# Patient Record
Sex: Female | Born: 1970 | Race: White | Hispanic: No | Marital: Single | Smoking: Current every day smoker
Health system: Southern US, Community
[De-identification: ages and names within clinical notes are randomized; demographics above are authoritative.]

## PROBLEM LIST (undated history)

## (undated) DIAGNOSIS — R079 Chest pain, unspecified: Secondary | ICD-10-CM

## (undated) DIAGNOSIS — Z720 Tobacco use: Secondary | ICD-10-CM

## (undated) DIAGNOSIS — E785 Hyperlipidemia, unspecified: Secondary | ICD-10-CM

## (undated) DIAGNOSIS — E039 Hypothyroidism, unspecified: Secondary | ICD-10-CM

## (undated) HISTORY — DX: Hyperlipidemia, unspecified: E78.5

## (undated) HISTORY — PX: CARPAL TUNNEL RELEASE: SHX101

## (undated) HISTORY — DX: Hypothyroidism, unspecified: E03.9

## (undated) HISTORY — DX: Chest pain, unspecified: R07.9

## (undated) HISTORY — DX: Tobacco use: Z72.0

## (undated) HISTORY — PX: PARTIAL HYSTERECTOMY: SHX80

---

## 2003-01-25 ENCOUNTER — Ambulatory Visit (HOSPITAL_COMMUNITY): Admission: RE | Admit: 2003-01-25 | Discharge: 2003-01-25 | Payer: Self-pay | Admitting: Obstetrics and Gynecology

## 2003-06-03 ENCOUNTER — Other Ambulatory Visit: Admission: RE | Admit: 2003-06-03 | Discharge: 2003-06-03 | Payer: Self-pay | Admitting: Obstetrics and Gynecology

## 2004-06-22 ENCOUNTER — Other Ambulatory Visit: Admission: RE | Admit: 2004-06-22 | Discharge: 2004-06-22 | Payer: Self-pay | Admitting: Obstetrics and Gynecology

## 2005-04-06 ENCOUNTER — Encounter: Admission: RE | Admit: 2005-04-06 | Discharge: 2005-04-06 | Payer: Self-pay | Admitting: Obstetrics and Gynecology

## 2005-05-26 ENCOUNTER — Inpatient Hospital Stay (HOSPITAL_COMMUNITY): Admission: AD | Admit: 2005-05-26 | Discharge: 2005-05-26 | Payer: Self-pay | Admitting: Obstetrics and Gynecology

## 2005-06-02 ENCOUNTER — Inpatient Hospital Stay (HOSPITAL_COMMUNITY): Admission: RE | Admit: 2005-06-02 | Discharge: 2005-06-05 | Payer: Self-pay | Admitting: Obstetrics and Gynecology

## 2005-07-15 ENCOUNTER — Other Ambulatory Visit: Admission: RE | Admit: 2005-07-15 | Discharge: 2005-07-15 | Payer: Self-pay | Admitting: Obstetrics and Gynecology

## 2006-08-30 ENCOUNTER — Ambulatory Visit (HOSPITAL_COMMUNITY): Admission: RE | Admit: 2006-08-30 | Discharge: 2006-08-31 | Payer: Self-pay | Admitting: Obstetrics and Gynecology

## 2013-02-13 ENCOUNTER — Other Ambulatory Visit (HOSPITAL_COMMUNITY): Payer: Self-pay | Admitting: Obstetrics and Gynecology

## 2013-02-13 ENCOUNTER — Other Ambulatory Visit: Payer: Self-pay | Admitting: Obstetrics and Gynecology

## 2013-02-13 DIAGNOSIS — R1011 Right upper quadrant pain: Secondary | ICD-10-CM

## 2013-02-13 DIAGNOSIS — N644 Mastodynia: Secondary | ICD-10-CM

## 2013-02-14 ENCOUNTER — Ambulatory Visit (HOSPITAL_COMMUNITY): Payer: Self-pay

## 2013-02-19 ENCOUNTER — Ambulatory Visit (HOSPITAL_COMMUNITY)
Admission: RE | Admit: 2013-02-19 | Discharge: 2013-02-19 | Disposition: A | Discharge status: Home / Self Care | Payer: BC Managed Care – PPO | Source: Ambulatory Visit | Attending: Obstetrics and Gynecology | Admitting: Obstetrics and Gynecology

## 2013-02-19 DIAGNOSIS — R1011 Right upper quadrant pain: Secondary | ICD-10-CM

## 2013-02-19 DIAGNOSIS — R110 Nausea: Secondary | ICD-10-CM | POA: Insufficient documentation

## 2013-02-22 ENCOUNTER — Ambulatory Visit
Admission: RE | Admit: 2013-02-22 | Discharge: 2013-02-22 | Disposition: A | Discharge status: Home / Self Care | Payer: BC Managed Care – PPO | Source: Ambulatory Visit | Attending: Obstetrics and Gynecology | Admitting: Obstetrics and Gynecology

## 2013-02-22 DIAGNOSIS — N644 Mastodynia: Secondary | ICD-10-CM

## 2015-04-14 ENCOUNTER — Other Ambulatory Visit (HOSPITAL_COMMUNITY): Payer: Self-pay | Admitting: Obstetrics and Gynecology

## 2015-04-15 LAB — CYTOLOGY - PAP

## 2015-12-07 HISTORY — PX: BREAST BIOPSY: SHX20

## 2016-04-26 DIAGNOSIS — Z01419 Encounter for gynecological examination (general) (routine) without abnormal findings: Secondary | ICD-10-CM | POA: Diagnosis not present

## 2016-04-26 DIAGNOSIS — Z1321 Encounter for screening for nutritional disorder: Secondary | ICD-10-CM | POA: Diagnosis not present

## 2016-04-26 DIAGNOSIS — Z01411 Encounter for gynecological examination (general) (routine) with abnormal findings: Secondary | ICD-10-CM | POA: Diagnosis not present

## 2016-04-26 DIAGNOSIS — N951 Menopausal and female climacteric states: Secondary | ICD-10-CM | POA: Diagnosis not present

## 2016-04-26 DIAGNOSIS — Z6838 Body mass index (BMI) 38.0-38.9, adult: Secondary | ICD-10-CM | POA: Diagnosis not present

## 2016-04-26 DIAGNOSIS — N644 Mastodynia: Secondary | ICD-10-CM | POA: Diagnosis not present

## 2016-04-26 DIAGNOSIS — R232 Flushing: Secondary | ICD-10-CM | POA: Diagnosis not present

## 2016-04-27 ENCOUNTER — Other Ambulatory Visit: Payer: Self-pay | Admitting: Obstetrics and Gynecology

## 2016-04-27 DIAGNOSIS — N644 Mastodynia: Secondary | ICD-10-CM

## 2016-04-30 ENCOUNTER — Other Ambulatory Visit: Payer: Self-pay

## 2016-05-04 ENCOUNTER — Other Ambulatory Visit: Payer: Self-pay | Admitting: Obstetrics and Gynecology

## 2016-05-04 ENCOUNTER — Ambulatory Visit
Admission: RE | Admit: 2016-05-04 | Discharge: 2016-05-04 | Disposition: A | Discharge status: Home / Self Care | Payer: BLUE CROSS/BLUE SHIELD | Source: Ambulatory Visit | Attending: Obstetrics and Gynecology | Admitting: Obstetrics and Gynecology

## 2016-05-04 DIAGNOSIS — N644 Mastodynia: Secondary | ICD-10-CM

## 2016-05-04 DIAGNOSIS — R928 Other abnormal and inconclusive findings on diagnostic imaging of breast: Secondary | ICD-10-CM

## 2016-05-06 ENCOUNTER — Other Ambulatory Visit: Payer: Self-pay | Admitting: Obstetrics and Gynecology

## 2016-05-06 ENCOUNTER — Ambulatory Visit
Admission: RE | Admit: 2016-05-06 | Discharge: 2016-05-06 | Disposition: A | Discharge status: Home / Self Care | Payer: BLUE CROSS/BLUE SHIELD | Source: Ambulatory Visit | Attending: Obstetrics and Gynecology | Admitting: Obstetrics and Gynecology

## 2016-05-06 DIAGNOSIS — R928 Other abnormal and inconclusive findings on diagnostic imaging of breast: Secondary | ICD-10-CM

## 2016-05-06 DIAGNOSIS — N6012 Diffuse cystic mastopathy of left breast: Secondary | ICD-10-CM | POA: Diagnosis not present

## 2016-05-08 DIAGNOSIS — F17200 Nicotine dependence, unspecified, uncomplicated: Secondary | ICD-10-CM | POA: Diagnosis not present

## 2016-05-08 DIAGNOSIS — G5762 Lesion of plantar nerve, left lower limb: Secondary | ICD-10-CM | POA: Diagnosis not present

## 2016-05-17 DIAGNOSIS — E039 Hypothyroidism, unspecified: Secondary | ICD-10-CM | POA: Diagnosis not present

## 2016-05-17 DIAGNOSIS — N644 Mastodynia: Secondary | ICD-10-CM | POA: Diagnosis not present

## 2016-05-17 DIAGNOSIS — E7800 Pure hypercholesterolemia, unspecified: Secondary | ICD-10-CM | POA: Diagnosis not present

## 2016-05-17 DIAGNOSIS — R7301 Impaired fasting glucose: Secondary | ICD-10-CM | POA: Diagnosis not present

## 2016-05-31 DIAGNOSIS — R635 Abnormal weight gain: Secondary | ICD-10-CM | POA: Diagnosis not present

## 2016-05-31 DIAGNOSIS — R5383 Other fatigue: Secondary | ICD-10-CM | POA: Diagnosis not present

## 2016-05-31 DIAGNOSIS — E039 Hypothyroidism, unspecified: Secondary | ICD-10-CM | POA: Diagnosis not present

## 2016-05-31 DIAGNOSIS — R7301 Impaired fasting glucose: Secondary | ICD-10-CM | POA: Diagnosis not present

## 2016-08-13 ENCOUNTER — Other Ambulatory Visit: Payer: Self-pay | Admitting: Obstetrics and Gynecology

## 2016-08-13 DIAGNOSIS — R928 Other abnormal and inconclusive findings on diagnostic imaging of breast: Secondary | ICD-10-CM

## 2016-08-13 DIAGNOSIS — N6452 Nipple discharge: Secondary | ICD-10-CM

## 2016-08-13 DIAGNOSIS — N6012 Diffuse cystic mastopathy of left breast: Secondary | ICD-10-CM

## 2016-08-30 ENCOUNTER — Other Ambulatory Visit: Payer: BLUE CROSS/BLUE SHIELD

## 2016-09-28 DIAGNOSIS — M542 Cervicalgia: Secondary | ICD-10-CM | POA: Diagnosis not present

## 2016-09-28 DIAGNOSIS — M7581 Other shoulder lesions, right shoulder: Secondary | ICD-10-CM | POA: Diagnosis not present

## 2016-09-28 DIAGNOSIS — G5603 Carpal tunnel syndrome, bilateral upper limbs: Secondary | ICD-10-CM | POA: Diagnosis not present

## 2016-11-08 ENCOUNTER — Ambulatory Visit
Admission: RE | Admit: 2016-11-08 | Discharge: 2016-11-08 | Disposition: A | Discharge status: Home / Self Care | Payer: BLUE CROSS/BLUE SHIELD | Source: Ambulatory Visit | Attending: Obstetrics and Gynecology | Admitting: Obstetrics and Gynecology

## 2016-11-08 ENCOUNTER — Other Ambulatory Visit: Payer: Self-pay | Admitting: Physician Assistant

## 2016-11-08 DIAGNOSIS — R1011 Right upper quadrant pain: Secondary | ICD-10-CM

## 2016-11-08 DIAGNOSIS — R928 Other abnormal and inconclusive findings on diagnostic imaging of breast: Secondary | ICD-10-CM

## 2016-11-08 DIAGNOSIS — R112 Nausea with vomiting, unspecified: Secondary | ICD-10-CM

## 2016-11-08 DIAGNOSIS — R1013 Epigastric pain: Secondary | ICD-10-CM

## 2016-11-08 DIAGNOSIS — R198 Other specified symptoms and signs involving the digestive system and abdomen: Secondary | ICD-10-CM | POA: Diagnosis not present

## 2016-11-15 ENCOUNTER — Other Ambulatory Visit: Payer: BLUE CROSS/BLUE SHIELD

## 2016-11-15 ENCOUNTER — Ambulatory Visit
Admission: RE | Admit: 2016-11-15 | Discharge: 2016-11-15 | Disposition: A | Discharge status: Home / Self Care | Payer: BLUE CROSS/BLUE SHIELD | Source: Ambulatory Visit | Attending: Physician Assistant | Admitting: Physician Assistant

## 2016-11-15 DIAGNOSIS — R1011 Right upper quadrant pain: Secondary | ICD-10-CM | POA: Diagnosis not present

## 2016-11-15 DIAGNOSIS — R112 Nausea with vomiting, unspecified: Secondary | ICD-10-CM | POA: Diagnosis not present

## 2016-11-15 DIAGNOSIS — R1013 Epigastric pain: Secondary | ICD-10-CM

## 2016-11-15 DIAGNOSIS — K209 Esophagitis, unspecified: Secondary | ICD-10-CM | POA: Diagnosis not present

## 2016-11-16 ENCOUNTER — Other Ambulatory Visit: Payer: BLUE CROSS/BLUE SHIELD

## 2016-11-22 DIAGNOSIS — E039 Hypothyroidism, unspecified: Secondary | ICD-10-CM | POA: Diagnosis not present

## 2016-11-22 DIAGNOSIS — R1013 Epigastric pain: Secondary | ICD-10-CM | POA: Diagnosis not present

## 2016-11-22 DIAGNOSIS — R7301 Impaired fasting glucose: Secondary | ICD-10-CM | POA: Diagnosis not present

## 2016-11-22 DIAGNOSIS — E7800 Pure hypercholesterolemia, unspecified: Secondary | ICD-10-CM | POA: Diagnosis not present

## 2016-11-22 DIAGNOSIS — Z800 Family history of malignant neoplasm of digestive organs: Secondary | ICD-10-CM | POA: Diagnosis not present

## 2016-12-06 HISTORY — PX: BREAST BIOPSY: SHX20

## 2016-12-13 DIAGNOSIS — R7301 Impaired fasting glucose: Secondary | ICD-10-CM | POA: Diagnosis not present

## 2016-12-13 DIAGNOSIS — R5383 Other fatigue: Secondary | ICD-10-CM | POA: Diagnosis not present

## 2016-12-13 DIAGNOSIS — E039 Hypothyroidism, unspecified: Secondary | ICD-10-CM | POA: Diagnosis not present

## 2016-12-13 DIAGNOSIS — R635 Abnormal weight gain: Secondary | ICD-10-CM | POA: Diagnosis not present

## 2017-01-27 ENCOUNTER — Other Ambulatory Visit: Payer: Self-pay | Admitting: Obstetrics and Gynecology

## 2017-01-27 DIAGNOSIS — N6012 Diffuse cystic mastopathy of left breast: Secondary | ICD-10-CM

## 2017-05-03 DIAGNOSIS — Z6836 Body mass index (BMI) 36.0-36.9, adult: Secondary | ICD-10-CM | POA: Diagnosis not present

## 2017-05-03 DIAGNOSIS — Z01419 Encounter for gynecological examination (general) (routine) without abnormal findings: Secondary | ICD-10-CM | POA: Diagnosis not present

## 2017-05-09 ENCOUNTER — Ambulatory Visit
Admission: RE | Admit: 2017-05-09 | Discharge: 2017-05-09 | Disposition: A | Discharge status: Home / Self Care | Payer: BLUE CROSS/BLUE SHIELD | Source: Ambulatory Visit | Attending: Obstetrics and Gynecology | Admitting: Obstetrics and Gynecology

## 2017-05-09 ENCOUNTER — Other Ambulatory Visit: Payer: Self-pay | Admitting: Obstetrics and Gynecology

## 2017-05-09 DIAGNOSIS — N6012 Diffuse cystic mastopathy of left breast: Secondary | ICD-10-CM

## 2017-05-09 DIAGNOSIS — Z131 Encounter for screening for diabetes mellitus: Secondary | ICD-10-CM | POA: Diagnosis not present

## 2017-05-09 DIAGNOSIS — E039 Hypothyroidism, unspecified: Secondary | ICD-10-CM | POA: Diagnosis not present

## 2017-05-09 DIAGNOSIS — N6489 Other specified disorders of breast: Secondary | ICD-10-CM | POA: Diagnosis not present

## 2017-05-09 DIAGNOSIS — E7800 Pure hypercholesterolemia, unspecified: Secondary | ICD-10-CM | POA: Diagnosis not present

## 2017-05-09 DIAGNOSIS — R928 Other abnormal and inconclusive findings on diagnostic imaging of breast: Secondary | ICD-10-CM | POA: Diagnosis not present

## 2017-05-09 DIAGNOSIS — R7301 Impaired fasting glucose: Secondary | ICD-10-CM | POA: Diagnosis not present

## 2017-05-11 ENCOUNTER — Ambulatory Visit
Admission: RE | Admit: 2017-05-11 | Discharge: 2017-05-11 | Disposition: A | Discharge status: Home / Self Care | Payer: BLUE CROSS/BLUE SHIELD | Source: Ambulatory Visit | Attending: Obstetrics and Gynecology | Admitting: Obstetrics and Gynecology

## 2017-05-11 ENCOUNTER — Other Ambulatory Visit: Payer: Self-pay | Admitting: Obstetrics and Gynecology

## 2017-05-11 DIAGNOSIS — N6489 Other specified disorders of breast: Secondary | ICD-10-CM

## 2017-05-11 DIAGNOSIS — N6012 Diffuse cystic mastopathy of left breast: Secondary | ICD-10-CM | POA: Diagnosis not present

## 2017-05-30 DIAGNOSIS — R635 Abnormal weight gain: Secondary | ICD-10-CM | POA: Diagnosis not present

## 2017-05-30 DIAGNOSIS — E039 Hypothyroidism, unspecified: Secondary | ICD-10-CM | POA: Diagnosis not present

## 2017-05-30 DIAGNOSIS — R7301 Impaired fasting glucose: Secondary | ICD-10-CM | POA: Diagnosis not present

## 2017-05-30 DIAGNOSIS — R5383 Other fatigue: Secondary | ICD-10-CM | POA: Diagnosis not present

## 2017-06-14 DIAGNOSIS — L723 Sebaceous cyst: Secondary | ICD-10-CM | POA: Diagnosis not present

## 2017-09-18 IMAGING — MG MM DIAG BREAST TOMO UNI LEFT
6 series · 6 of 14 positions shown · non-contrast
Comparison: Previous exam(s).

CLINICAL DATA: Post biopsy mammogram of the left breast for clip
placement.

EXAM:
3D DIAGNOSTIC LEFT MAMMOGRAM POST STEREOTACTIC BIOPSY

[L ML]
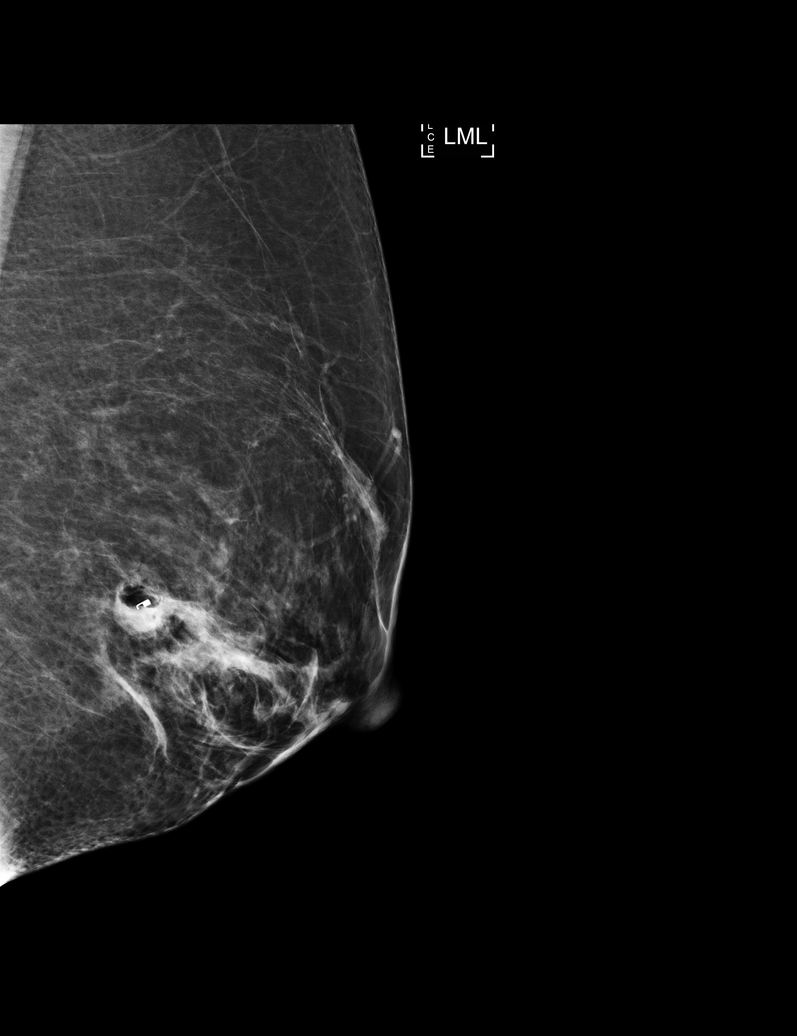

[L ML synth-2D]
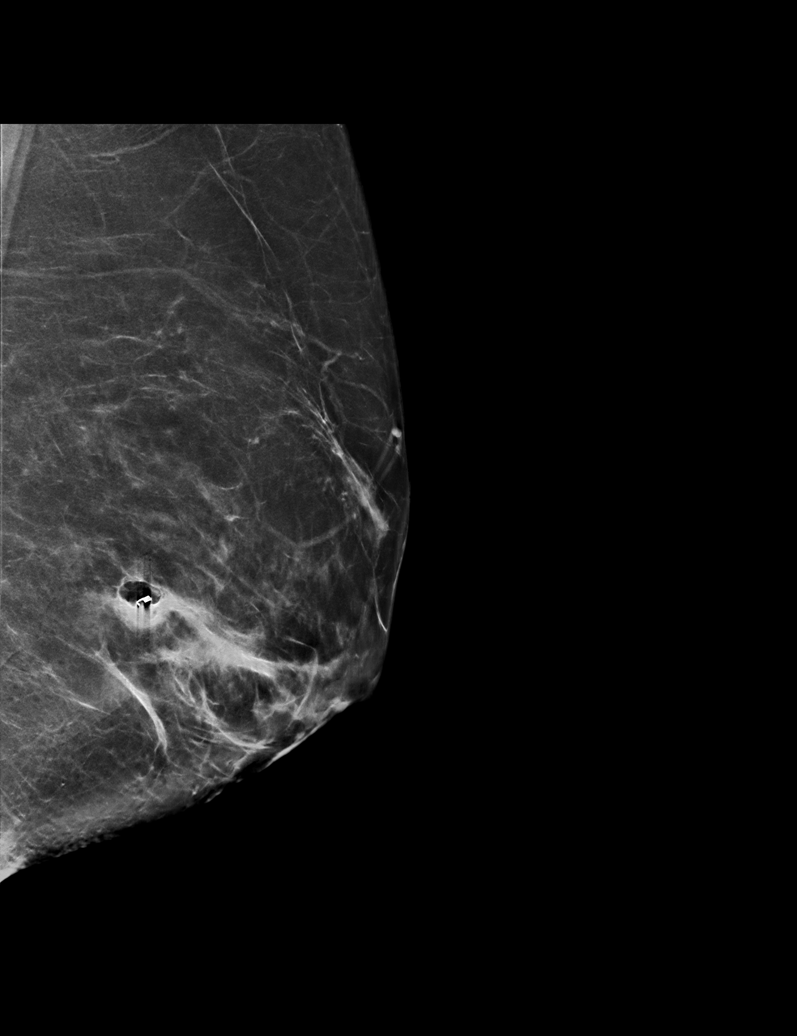

[L CC]
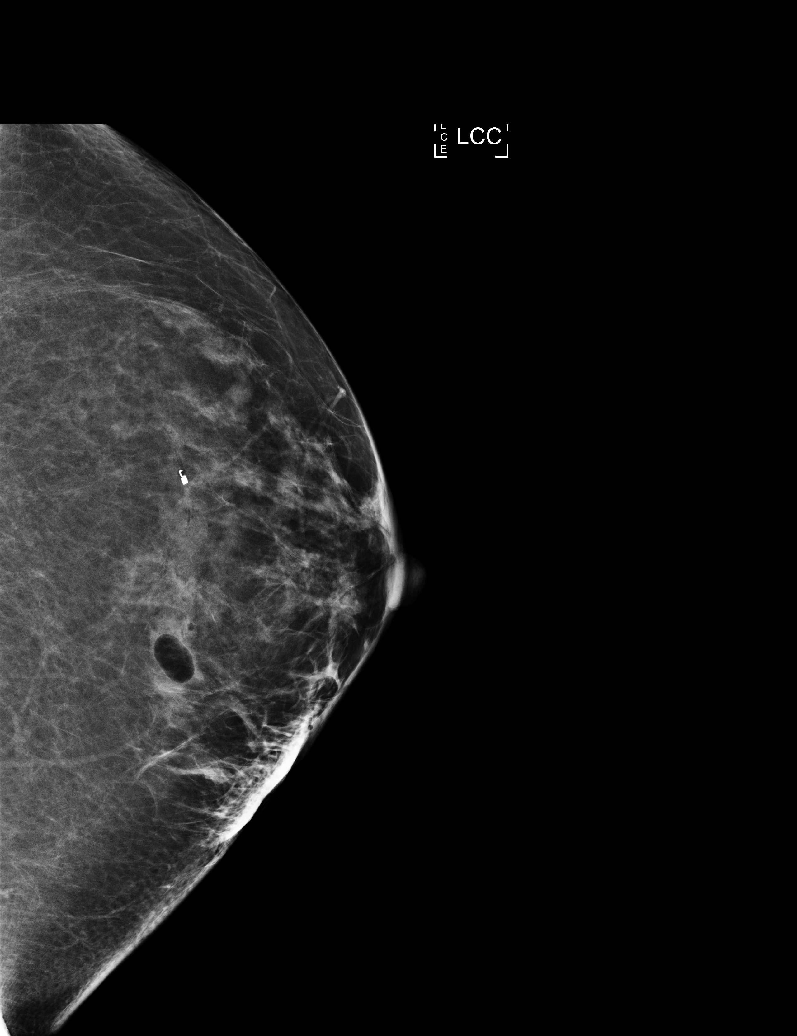

[L CC synth-2D]
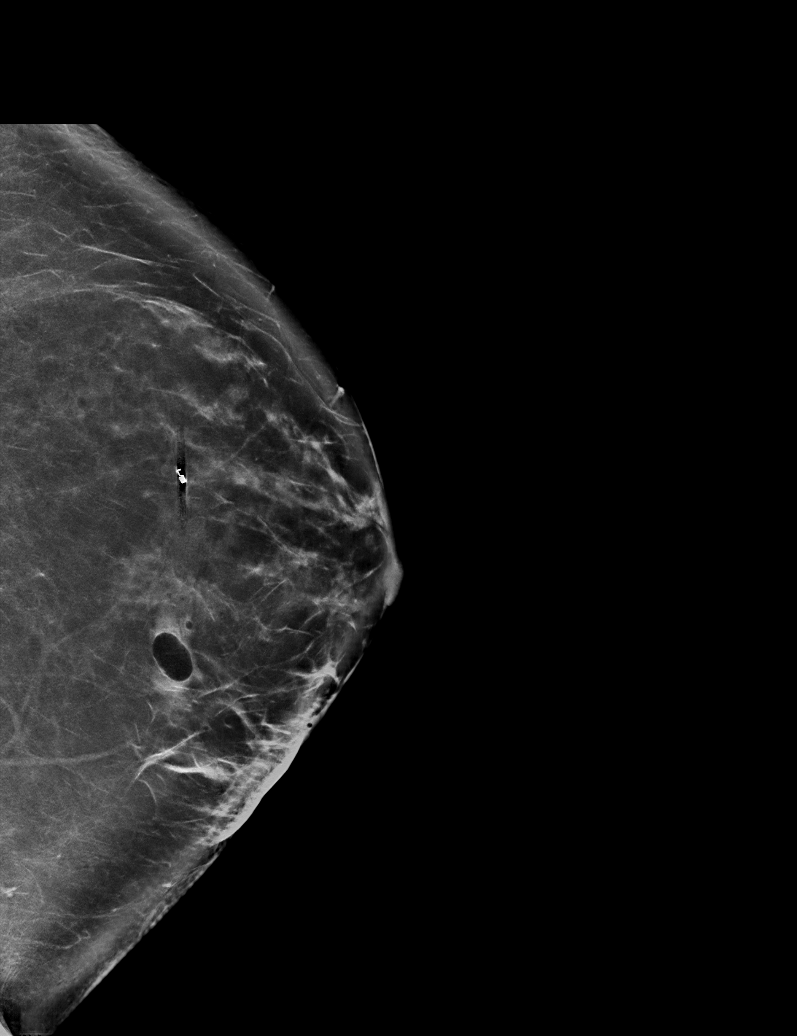

[L ML tomo · tomo slice 42/83.0]
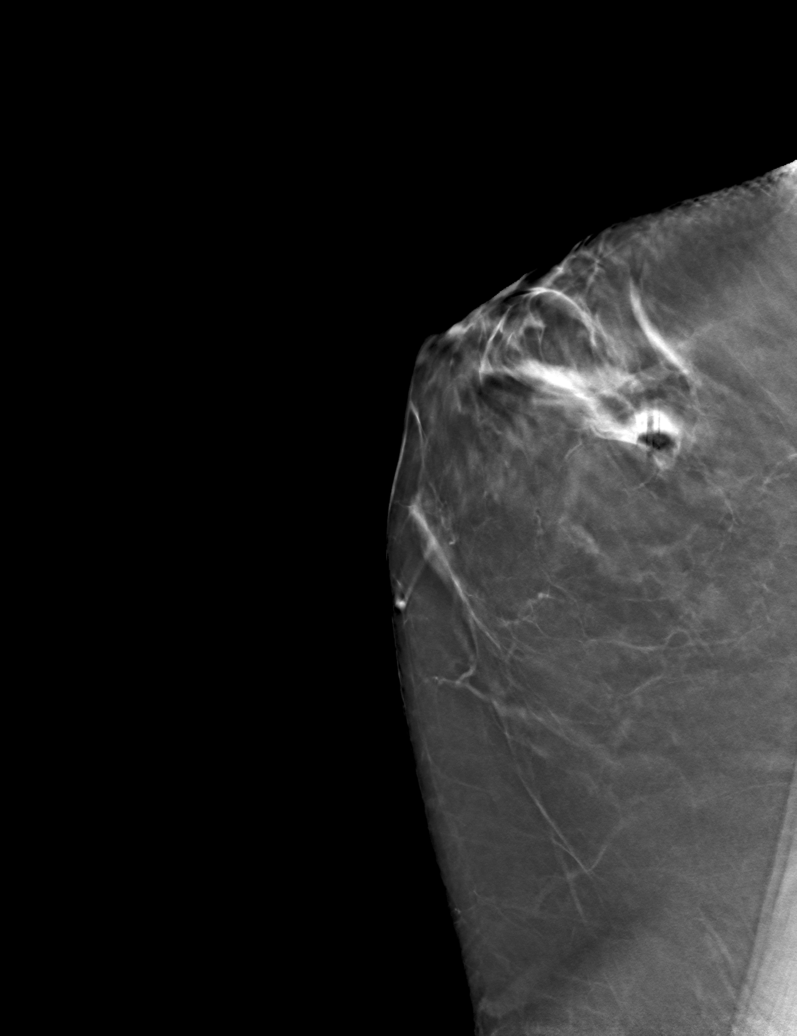

[L CC tomo · tomo slice 43/85.0]
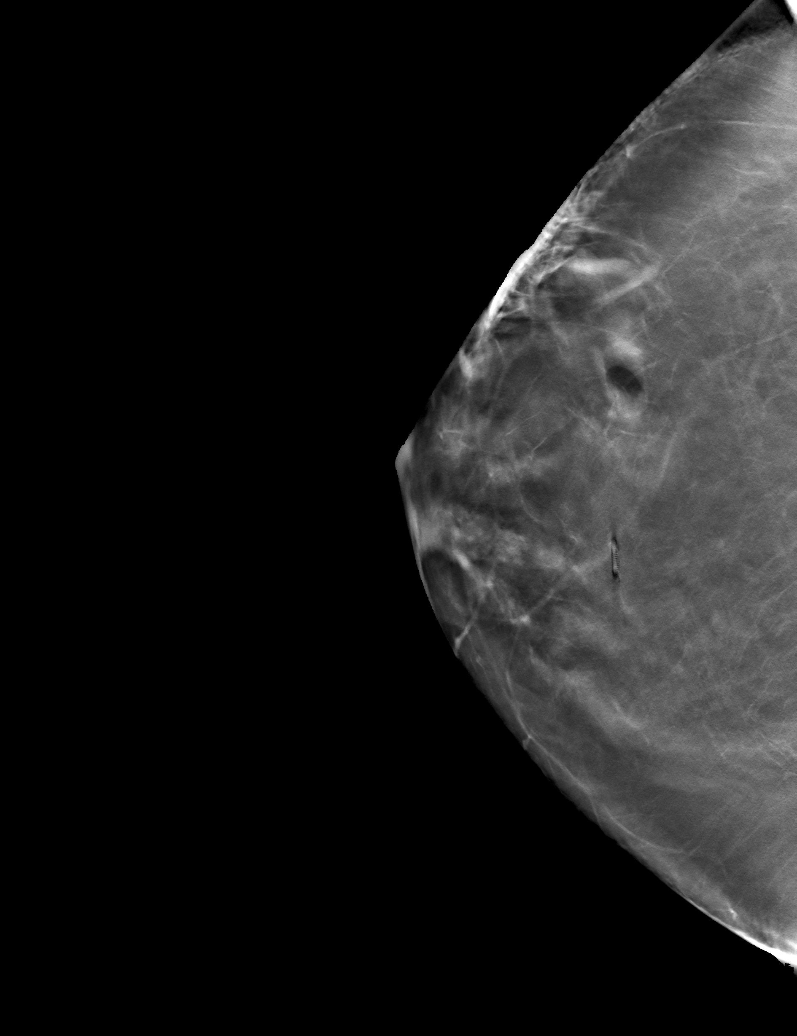

[6 of 14 positions shown; findings below may reference images not displayed]

FINDINGS: 3D Mammographic images were obtained following stereotactic guided
biopsy of an asymmetry in the lower-inner left breast. The coil
shaped biopsy marking clip is approximately 3.7 cm laterally
displaced from the site of biopsy.
IMPRESSION: Approximately 3.7 cm of lateral displacement from the site of
biopsy.

Final Assessment: Post Procedure Mammograms for Marker Placement

## 2017-11-07 DIAGNOSIS — E039 Hypothyroidism, unspecified: Secondary | ICD-10-CM | POA: Diagnosis not present

## 2017-11-07 DIAGNOSIS — R7301 Impaired fasting glucose: Secondary | ICD-10-CM | POA: Diagnosis not present

## 2017-11-07 DIAGNOSIS — E7800 Pure hypercholesterolemia, unspecified: Secondary | ICD-10-CM | POA: Diagnosis not present

## 2018-01-23 DIAGNOSIS — E039 Hypothyroidism, unspecified: Secondary | ICD-10-CM | POA: Diagnosis not present

## 2018-01-23 DIAGNOSIS — J111 Influenza due to unidentified influenza virus with other respiratory manifestations: Secondary | ICD-10-CM | POA: Diagnosis not present

## 2018-01-30 DIAGNOSIS — R5383 Other fatigue: Secondary | ICD-10-CM | POA: Diagnosis not present

## 2018-01-30 DIAGNOSIS — E039 Hypothyroidism, unspecified: Secondary | ICD-10-CM | POA: Diagnosis not present

## 2018-01-30 DIAGNOSIS — R635 Abnormal weight gain: Secondary | ICD-10-CM | POA: Diagnosis not present

## 2018-01-30 DIAGNOSIS — R7301 Impaired fasting glucose: Secondary | ICD-10-CM | POA: Diagnosis not present

## 2018-02-23 ENCOUNTER — Other Ambulatory Visit: Payer: Self-pay | Admitting: Obstetrics and Gynecology

## 2018-02-23 DIAGNOSIS — Z139 Encounter for screening, unspecified: Secondary | ICD-10-CM

## 2018-03-27 DIAGNOSIS — E039 Hypothyroidism, unspecified: Secondary | ICD-10-CM | POA: Diagnosis not present

## 2018-05-15 ENCOUNTER — Ambulatory Visit
Admission: RE | Admit: 2018-05-15 | Discharge: 2018-05-15 | Disposition: A | Discharge status: Home / Self Care | Payer: BLUE CROSS/BLUE SHIELD | Source: Ambulatory Visit | Attending: Obstetrics and Gynecology | Admitting: Obstetrics and Gynecology

## 2018-05-15 DIAGNOSIS — Z1231 Encounter for screening mammogram for malignant neoplasm of breast: Secondary | ICD-10-CM | POA: Diagnosis not present

## 2018-05-15 DIAGNOSIS — Z139 Encounter for screening, unspecified: Secondary | ICD-10-CM

## 2018-05-22 DIAGNOSIS — Z01419 Encounter for gynecological examination (general) (routine) without abnormal findings: Secondary | ICD-10-CM | POA: Diagnosis not present

## 2018-05-22 DIAGNOSIS — E039 Hypothyroidism, unspecified: Secondary | ICD-10-CM | POA: Diagnosis not present

## 2018-05-22 DIAGNOSIS — Z6833 Body mass index (BMI) 33.0-33.9, adult: Secondary | ICD-10-CM | POA: Diagnosis not present

## 2018-05-29 DIAGNOSIS — G5602 Carpal tunnel syndrome, left upper limb: Secondary | ICD-10-CM | POA: Diagnosis not present

## 2018-06-10 DIAGNOSIS — S61233A Puncture wound without foreign body of left middle finger without damage to nail, initial encounter: Secondary | ICD-10-CM | POA: Diagnosis not present

## 2018-06-10 DIAGNOSIS — L03012 Cellulitis of left finger: Secondary | ICD-10-CM | POA: Diagnosis not present

## 2018-06-14 DIAGNOSIS — L02512 Cutaneous abscess of left hand: Secondary | ICD-10-CM | POA: Diagnosis not present

## 2018-06-14 DIAGNOSIS — L0390 Cellulitis, unspecified: Secondary | ICD-10-CM | POA: Diagnosis not present

## 2018-07-10 DIAGNOSIS — R7301 Impaired fasting glucose: Secondary | ICD-10-CM | POA: Diagnosis not present

## 2018-07-10 DIAGNOSIS — E039 Hypothyroidism, unspecified: Secondary | ICD-10-CM | POA: Diagnosis not present

## 2018-07-10 DIAGNOSIS — E7800 Pure hypercholesterolemia, unspecified: Secondary | ICD-10-CM | POA: Diagnosis not present

## 2018-07-10 DIAGNOSIS — G5602 Carpal tunnel syndrome, left upper limb: Secondary | ICD-10-CM | POA: Diagnosis not present

## 2018-07-31 DIAGNOSIS — E039 Hypothyroidism, unspecified: Secondary | ICD-10-CM | POA: Diagnosis not present

## 2018-07-31 DIAGNOSIS — R5383 Other fatigue: Secondary | ICD-10-CM | POA: Diagnosis not present

## 2018-07-31 DIAGNOSIS — R635 Abnormal weight gain: Secondary | ICD-10-CM | POA: Diagnosis not present

## 2018-07-31 DIAGNOSIS — R7301 Impaired fasting glucose: Secondary | ICD-10-CM | POA: Diagnosis not present

## 2018-12-29 ENCOUNTER — Other Ambulatory Visit: Payer: Self-pay | Admitting: *Deleted

## 2018-12-29 ENCOUNTER — Encounter: Payer: Self-pay | Admitting: Neurology

## 2018-12-29 DIAGNOSIS — G5602 Carpal tunnel syndrome, left upper limb: Secondary | ICD-10-CM

## 2019-01-16 ENCOUNTER — Encounter: Payer: Self-pay | Admitting: Neurology

## 2019-01-16 ENCOUNTER — Ambulatory Visit (INDEPENDENT_AMBULATORY_CARE_PROVIDER_SITE_OTHER): Payer: BLUE CROSS/BLUE SHIELD | Admitting: Neurology

## 2019-01-16 DIAGNOSIS — G5602 Carpal tunnel syndrome, left upper limb: Secondary | ICD-10-CM

## 2019-01-16 NOTE — Procedures (Signed)
[facility]  [address], [address]  [place], [place] [address] Tel: [phone] Fax:  [phone] Test Date:  01/16/2019  Patient: Margaret Goodwin DOB: May 12, 1971 Physician: Nita Sickle, DO  Sex: Female Height: 5\' 4"  Ref Phys: Teryl Lucy, MD  ID#: [id number] Temp: 35.7C Technician:    Patient Complaints: This is a 49 year old female with history of right carpal tunnel release referred for evaluation of left hand numbness and pain.  NCV & EMG Findings: Extensive electrodiagnostic testing of the left upper extremity shows:  1. Left median sensory response shows prolonged distal peak latency (3.7 ms) and reduced amplitude (18.2 V).  Left ulnar sensory responses within normal limits. 2. Left median motor response shows prolonged distal onset latency (4.3 ms).  Left ulnar motor responses within normal limits.  3. There is no evidence of active or chronic motor axonal loss changes affecting any of the tested muscles.  Motor unit configuration and recruitment pattern is within normal limits.    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     ___________________________ Nita Sickle, DO    Nerve Conduction Studies Anti Sensory Summary Table   Site NR Peak (ms) Norm Peak (ms) P-T Amp (V) Norm P-T Amp  Left Median Anti Sensory (2nd Digit)  35.7C  Wrist    3.7 <3.4 18.2 >20  Left Ulnar Anti Sensory (5th Digit)  35.7C  Wrist    2.3 <3.1 36.6 >12   Motor Summary Table   Site NR Onset (ms) Norm Onset (ms) O-P Amp (mV) Norm O-P Amp Site1 Site2 Delta-0 (ms) Dist (cm) Vel (m/s) Norm Vel (m/s)  Left Median Motor (Abd Poll Brev)  35.7C  Wrist    4.3 <3.9 12.5 >6 Elbow Wrist 4.6 27.0 59 >50  Elbow    8.9  12.0         Left Ulnar Motor (Abd Dig Minimi)  35.7C  Wrist    2.2 <3.1 10.4 >7 B Elbow Wrist 3.3 22.0 67 >50  B Elbow    5.5  10.1  A Elbow B Elbow 1.5 10.0 67 >50  A Elbow     7.0  9.8          EMG   Side Muscle Ins Act Fibs Psw Fasc Number Recrt Dur Dur. Amp Amp. Poly Poly. Comment  Left 1stDorInt Nml Nml Nml Nml Nml Nml Nml Nml Nml Nml Nml Nml N/A  Left Abd Poll Brev Nml Nml Nml Nml Nml Nml Nml Nml Nml Nml Nml Nml N/A  Left PronatorTeres Nml Nml Nml Nml Nml Nml Nml Nml Nml Nml Nml Nml N/A  Left Biceps Nml Nml Nml Nml Nml Nml Nml Nml Nml Nml Nml Nml N/A  Left Triceps Nml Nml Nml Nml Nml Nml Nml Nml Nml Nml Nml Nml N/A  Left Deltoid Nml Nml Nml Nml Nml Nml Nml Nml Nml Nml Nml Nml N/A      Waveforms:

## 2019-04-20 ENCOUNTER — Other Ambulatory Visit: Payer: Self-pay | Admitting: Obstetrics and Gynecology

## 2019-04-20 DIAGNOSIS — Z1231 Encounter for screening mammogram for malignant neoplasm of breast: Secondary | ICD-10-CM

## 2019-06-11 ENCOUNTER — Ambulatory Visit
Admission: RE | Admit: 2019-06-11 | Discharge: 2019-06-11 | Disposition: A | Discharge status: Home / Self Care | Payer: BC Managed Care – PPO | Source: Ambulatory Visit | Attending: Obstetrics and Gynecology | Admitting: Obstetrics and Gynecology

## 2019-06-11 DIAGNOSIS — Z1231 Encounter for screening mammogram for malignant neoplasm of breast: Secondary | ICD-10-CM

## 2021-06-29 ENCOUNTER — Encounter (HOSPITAL_COMMUNITY): Payer: Self-pay

## 2021-06-29 ENCOUNTER — Emergency Department (HOSPITAL_COMMUNITY)
Admission: EM | Admit: 2021-06-29 | Discharge: 2021-06-30 | Disposition: A | Discharge status: Home / Self Care | Payer: BC Managed Care – PPO | Attending: Emergency Medicine | Admitting: Emergency Medicine

## 2021-06-29 ENCOUNTER — Other Ambulatory Visit: Payer: Self-pay

## 2021-06-29 DIAGNOSIS — Z20822 Contact with and (suspected) exposure to covid-19: Secondary | ICD-10-CM | POA: Insufficient documentation

## 2021-06-29 DIAGNOSIS — R5383 Other fatigue: Secondary | ICD-10-CM | POA: Diagnosis not present

## 2021-06-29 DIAGNOSIS — R0789 Other chest pain: Secondary | ICD-10-CM | POA: Insufficient documentation

## 2021-06-29 DIAGNOSIS — R1013 Epigastric pain: Secondary | ICD-10-CM | POA: Insufficient documentation

## 2021-06-29 DIAGNOSIS — R079 Chest pain, unspecified: Secondary | ICD-10-CM

## 2021-06-29 NOTE — ED Triage Notes (Signed)
Pt reports left shoulder pain that radiates to her chest since Saturday. Pt a.o, nad noted

## 2021-06-30 ENCOUNTER — Emergency Department (HOSPITAL_COMMUNITY): Payer: BC Managed Care – PPO

## 2021-06-30 LAB — TROPONIN I (HIGH SENSITIVITY): Troponin I (High Sensitivity): 3 ng/L (ref <18)

## 2021-06-30 LAB — BASIC METABOLIC PANEL
Anion gap: 6 (ref 5–15)
BUN: 11 mg/dL (ref 6–20)
CO2: 25 mmol/L (ref 22–32)
Calcium: 8.7 mg/dL — ABNORMAL LOW (ref 8.9–10.3)
Chloride: 106 mmol/L (ref 98–111)
Creatinine, Ser: 0.78 mg/dL (ref 0.44–1.00)
GFR, Estimated: >60 mL/min (ref >60)
Glucose, Bld: 96 mg/dL (ref 70–99)
Potassium: 3.9 mmol/L (ref 3.5–5.1)
Sodium: 137 mmol/L (ref 135–145)

## 2021-06-30 LAB — CBC WITH DIFFERENTIAL/PLATELET
Abs Immature Granulocytes: 0.01 10*3/uL (ref 0.00–0.07)
Basophils Absolute: 0 10*3/uL (ref 0.0–0.1)
Basophils Relative: 0 %
Eosinophils Absolute: 0.1 10*3/uL (ref 0.0–0.5)
Eosinophils Relative: 2 %
HCT: 38.7 % (ref 36.0–46.0)
Hemoglobin: 12.6 g/dL (ref 12.0–15.0)
Immature Granulocytes: 0 %
Lymphocytes Relative: 26 %
Lymphs Abs: 1.3 10*3/uL (ref 0.7–4.0)
MCH: 31.3 pg (ref 26.0–34.0)
MCHC: 32.6 g/dL (ref 30.0–36.0)
MCV: 96 fL (ref 80.0–100.0)
Monocytes Absolute: 0.4 10*3/uL (ref 0.1–1.0)
Monocytes Relative: 7 %
Neutro Abs: 3.3 10*3/uL (ref 1.7–7.7)
Neutrophils Relative %: 65 %
Platelets: 155 10*3/uL (ref 150–400)
RBC: 4.03 MIL/uL (ref 3.87–5.11)
RDW: 13.9 % (ref 11.5–15.5)
WBC: 5 10*3/uL (ref 4.0–10.5)
nRBC: 0 % (ref 0.0–0.2)

## 2021-06-30 LAB — RESP PANEL BY RT-PCR (FLU A&B, COVID) ARPGX2
Influenza A by PCR: NEGATIVE
Influenza B by PCR: NEGATIVE
SARS Coronavirus 2 by RT PCR: NEGATIVE

## 2021-06-30 LAB — TSH: TSH: 1.48 u[IU]/mL (ref 0.350–4.500)

## 2021-06-30 LAB — T4, FREE: Free T4: 1.14 ng/dL — ABNORMAL HIGH (ref 0.61–1.12)

## 2021-06-30 MED ORDER — LIDOCAINE VISCOUS HCL 2 % MT SOLN
15.0000 mL | Freq: Once | OROMUCOSAL | Status: AC
  Administered 2021-06-30: 15 mL via ORAL
  Filled 2021-06-30: qty 15

## 2021-06-30 MED ORDER — ALUM & MAG HYDROXIDE-SIMETH 200-200-20 MG/5ML PO SUSP
30.0000 mL | Freq: Once | ORAL | Status: AC
  Administered 2021-06-30: 30 mL via ORAL
  Filled 2021-06-30: qty 30

## 2021-06-30 NOTE — Discharge Instructions (Addendum)
We talked about your work-up for chest pain today.  I did not see any signs of heart emergency or other medical emergency.  However you do have risk factors for heart disease, and explained that this is not a complete work-up.  I placed the referral in our system to set up an appointment with our cardiology group.  If you do not hear from them in 2 business days, please call the number above with heart care to ask for an appointment.  Your COVID test was negative.  Your thyroid levels appear normal.  Your chest x-ray did show some signs of "pneumonitis".  This is a nonspecific finding, but may be inflammation related to smoking.  I would strongly encourage you to look for options to cut back or quit smoking.  It is possible that your symptoms are related to acid reflux/heartburn.  You can consider over-the-counter medications particularly with eating, and try not to eat 4 hours before bedtime.

## 2021-06-30 NOTE — ED Provider Notes (Signed)
[facility] EMERGENCY DEPARTMENT Provider Note   CSN: [id number] Arrival date & time: 06/29/21  1142     History Chief Complaint  Patient presents with   Chest Pain    Margaret Goodwin is a 50 y.o. female with a history of hypothyroidism, smoking, high cholesterol, presenting to the ED with chest pain.  Patient reports that 2 days ago at work (she works as Scientist, physiological) she began to develop left-sided neck pain and shoulder pain.  She thought it felt like a pulled muscle.  The pain had since gone away, but she developed a burning epigastric pain afterwards.  Epigastric pain is midsternal.  She said yesterday she felt generally fatigued.  She came to the ED yesterday evening unfortunately had a very prolonged stay due to long waiting times in the emergency department.  This morning in my evaluation she reports she continues to have some discomfort in the middle of her chest, but otherwise denies any left-sided chest pain, left shoulder pain, or shortness of breath.  She denies recent cough, fevers, chills.  She reports that she has intermittent nausea and suffers from reflux, but denies any vomiting or diarrhea.  She denies any known history of coronary disease.  She denies history of hypertension.  She does report a history of MI in her grandmother possibly under the age of 63, but reports no other significant family history at young age.  She reports she smokes half a pack of cigarettes per day.  She has never seen a cardiologist.  HPI     History reviewed. No pertinent past medical history.  There are no problems to display for this patient.   Past Surgical History:  Procedure Laterality Date   BREAST BIOPSY Left 2018   PSEUDOANGIOMATOUS STROMAL HYPERPLASIA,   BREAST BIOPSY Left 2017     OB History   No obstetric history on file.     Family History  Problem Relation Age of Onset   Breast cancer Maternal Aunt        early 59's       Home  Medications Prior to Admission medications   Not on File    Allergies    Amoxicillin and Keflex [cephalexin]  Review of Systems   Review of Systems  Constitutional:  Negative for chills and fever.  Eyes:  Negative for pain and visual disturbance.  Respiratory:  Positive for shortness of breath. Negative for cough.   Cardiovascular:  Positive for chest pain. Negative for palpitations.  Gastrointestinal:  Negative for abdominal pain and vomiting.  Genitourinary:  Negative for dysuria and hematuria.  Musculoskeletal:  Positive for arthralgias and myalgias.  Skin:  Negative for color change and rash.  Neurological:  Negative for syncope and headaches.  All other systems reviewed and are negative.  Physical Exam Updated Vital Signs BP (!) 104/58   Pulse (!) 50   Temp 99.5 F (37.5 C) (Oral) Comment: Simultaneous filing. User may not have seen previous data. Comment (Src): Simultaneous filing. User may not have seen previous data.  Resp 20   Ht 5\' 4"  (1.626 m)   Wt 83.9 kg   SpO2 100%   BMI 31.76 kg/m   Physical Exam Constitutional:      General: She is not in acute distress. HENT:     Head: Normocephalic and atraumatic.  Eyes:     Conjunctiva/sclera: Conjunctivae normal.     Pupils: Pupils are equal, round, and reactive to light.  Cardiovascular:  Rate and Rhythm: Normal rate and regular rhythm.  Pulmonary:     Effort: Pulmonary effort is normal. No respiratory distress.  Abdominal:     General: There is no distension.     Tenderness: There is no abdominal tenderness.  Skin:    General: Skin is warm and dry.  Neurological:     General: No focal deficit present.     Mental Status: She is alert. Mental status is at baseline.  Psychiatric:        Mood and Affect: Mood normal.        Behavior: Behavior normal.    ED Results / Procedures / Treatments   Labs (all labs ordered are listed, but only abnormal results are displayed) Labs Reviewed  BASIC METABOLIC  PANEL - Abnormal; Notable for the following components:      Result Value   Calcium 8.7 (*)    All other components within normal limits  T4, FREE - Abnormal; Notable for the following components:   Free T4 1.14 (*)    All other components within normal limits  RESP PANEL BY RT-PCR (FLU A&B, COVID) [id number]  CBC WITH DIFFERENTIAL/PLATELET  TSH  TROPONIN I (HIGH SENSITIVITY)  TROPONIN I (HIGH SENSITIVITY)    EKG EKG Interpretation  Date/Time:  Monday June 29 2021 12:58:59 EDT Ventricular Rate:  55 PR Interval:  164 QRS Duration: 94 QT Interval:  410 QTC Calculation: 392 R Axis:   66 Text Interpretation: Sinus bradycardia Incomplete right bundle branch block Borderline ECG Confirmed by Alvester Chou [phone]) on 06/30/2021 7:32:08 AM  Radiology DG Chest 2 View  Result Date: 06/30/2021 CLINICAL DATA:  Left-sided chest pain. EXAM: CHEST - 2 VIEW COMPARISON:  No prior. FINDINGS: Mediastinum hilar structures normal. Heart size normal. Mild bilateral interstitial prominence. These changes may be chronic. An acute process including pneumonitis cannot be completely excluded. No pleural effusion or pneumothorax. Degenerative change thoracic spine. IMPRESSION: Mild bilateral interstitial prominence. These changes may be chronic. An acute process including pneumonitis cannot be completely excluded Electronically Signed   By: Maisie Fus  Register   On: 06/30/2021 08:33    Procedures Procedures   Medications Ordered in ED Medications  alum & mag hydroxide-simeth (MAALOX/MYLANTA) 200-200-20 MG/5ML suspension 30 mL (30 mLs Oral Given 06/30/21 0807)    And  lidocaine (XYLOCAINE) 2 % viscous mouth solution 15 mL (15 mLs Oral Given 06/30/21 5643)    ED Course  I have reviewed the triage vital signs and the nursing notes.  Pertinent labs & imaging results that were available during my care of the patient were reviewed by me and considered in my medical decision making (see chart for details).  This  patient presents to the Emergency Department with complaint of chest pain. This involves an extensive number of treatment options, and is a complaint that carries with it a high risk of complications and morbidity.  The differential diagnosis includes ACS vs Pneumothorax vs Reflux/Gastritis vs MSK pain vs Pneumonia vs other.  I felt PE was less likely given that she is low-risk by [facility] Score  I ordered, reviewed, and interpreted labs, including BMP and CBC.  There were no immediate, life-threatening emergencies found in this labwork.  The patient's troponin level was 3.  TSH normal, T4 very mildly elevated at 1.14 (would not adjust her thyroid medications based on this).  Covid was negative. I ordered medication GI cocktial for chest pain I ordered imaging studies which included dg chest I independently visualized and interpreted imaging which  showed mild interstitial prominence, no acute PTX or PNA.  I personally reviewed the patients ECG which showed sinus rhythm with no acute ischemic findings  After the interventions stated above, I reevaluated the patient and found that they remained clinically stable.  She has a HEART score of 2-3 (for risk factors and history), and is overall low risk for MACE.  We discussed the need for cardiology f/u given her risk factors and exertional symptoms, and she verbalized understanding.  Based on the patient's clinical exam, vital signs, risk factors, and ED testing, I felt that the patient's overall risk of life-threatening emergency such as ACS, PE, sepsis, or infection was low.  At this time, I felt the patient's presentation was most clinically consistent with atypical chest pain/reflux, but explained to the patient that this evaluation was not a definitive diagnostic workup.  I discussed outpatient follow up with primary care provider, and provided specialist office number on the patient's discharge paper if a referral was deemed necessary.  Return  precautions were discussed with the patient.  I felt the patient was clinically stable for discharge.   Clinical Course as of 06/30/21 1034  Tue Jun 30, 2021  0927 Troponin I (High Sensitivity): 3 [MT]  [phone] SARS Coronavirus 2 by RT PCR: NEGATIVE [MT]  0931 The patient did have a GI cocktail.  She reports that she had very minimal chest discomfort to begin with, and is unsure whether she is having any at all right now.  I think she is reasonably safe for discharge at this time.  We discussed her work-up and blood tests and chest x-ray.  She has a very nonspecific finding of possible pneumonitis on the x-ray film of her lungs, and advised that she talk to her doctor about further attempts to cut back on smoking or quitting.  However do not think she has a bacterial pneumonia requiring antibiotics, and her COVID test is negative. [MT]    Clinical Course User Index [MT] Renaye Rakers Kermit Balo, MD    Final Clinical Impression(s) / ED Diagnoses Final diagnoses:  Chest pain, unspecified type    Rx / DC Orders ED Discharge Orders          Ordered    Ambulatory referral to Cardiology       Comments: Chest pain evaluation in ED, risk factors for cardiac disease   06/30/21 0933             Terald Sleeper, MD 06/30/21 1034

## 2021-06-30 NOTE — ED Notes (Signed)
ED Provider at bedside. 

## 2021-09-22 ENCOUNTER — Ambulatory Visit: Payer: BC Managed Care – PPO | Admitting: Cardiology

## 2021-10-15 DIAGNOSIS — Z808 Family history of malignant neoplasm of other organs or systems: Secondary | ICD-10-CM | POA: Diagnosis not present

## 2021-10-15 DIAGNOSIS — Z1231 Encounter for screening mammogram for malignant neoplasm of breast: Secondary | ICD-10-CM | POA: Diagnosis not present

## 2021-10-15 DIAGNOSIS — Z803 Family history of malignant neoplasm of breast: Secondary | ICD-10-CM | POA: Diagnosis not present

## 2021-10-15 DIAGNOSIS — Z8042 Family history of malignant neoplasm of prostate: Secondary | ICD-10-CM | POA: Diagnosis not present

## 2021-10-15 DIAGNOSIS — Z800 Family history of malignant neoplasm of digestive organs: Secondary | ICD-10-CM | POA: Diagnosis not present

## 2021-10-15 DIAGNOSIS — Z01419 Encounter for gynecological examination (general) (routine) without abnormal findings: Secondary | ICD-10-CM | POA: Diagnosis not present

## 2021-10-15 DIAGNOSIS — Z8051 Family history of malignant neoplasm of kidney: Secondary | ICD-10-CM | POA: Diagnosis not present

## 2021-10-15 DIAGNOSIS — Z6832 Body mass index (BMI) 32.0-32.9, adult: Secondary | ICD-10-CM | POA: Diagnosis not present

## 2021-10-26 DIAGNOSIS — J101 Influenza due to other identified influenza virus with other respiratory manifestations: Secondary | ICD-10-CM | POA: Diagnosis not present

## 2021-11-11 DIAGNOSIS — E7800 Pure hypercholesterolemia, unspecified: Secondary | ICD-10-CM | POA: Diagnosis not present

## 2021-11-11 DIAGNOSIS — E039 Hypothyroidism, unspecified: Secondary | ICD-10-CM | POA: Diagnosis not present

## 2021-12-10 ENCOUNTER — Ambulatory Visit: Payer: BC Managed Care – PPO | Admitting: Interventional Cardiology

## 2021-12-10 DIAGNOSIS — Z803 Family history of malignant neoplasm of breast: Secondary | ICD-10-CM | POA: Diagnosis not present

## 2021-12-10 DIAGNOSIS — Z9189 Other specified personal risk factors, not elsewhere classified: Secondary | ICD-10-CM | POA: Diagnosis not present

## 2021-12-11 ENCOUNTER — Other Ambulatory Visit: Payer: Self-pay | Admitting: Obstetrics and Gynecology

## 2021-12-11 DIAGNOSIS — Z803 Family history of malignant neoplasm of breast: Secondary | ICD-10-CM

## 2022-01-07 DIAGNOSIS — E039 Hypothyroidism, unspecified: Secondary | ICD-10-CM | POA: Diagnosis not present

## 2022-01-07 DIAGNOSIS — R7301 Impaired fasting glucose: Secondary | ICD-10-CM | POA: Diagnosis not present

## 2022-01-07 DIAGNOSIS — E7800 Pure hypercholesterolemia, unspecified: Secondary | ICD-10-CM | POA: Diagnosis not present

## 2022-01-14 DIAGNOSIS — R5383 Other fatigue: Secondary | ICD-10-CM | POA: Diagnosis not present

## 2022-01-14 DIAGNOSIS — M2550 Pain in unspecified joint: Secondary | ICD-10-CM | POA: Diagnosis not present

## 2022-01-14 DIAGNOSIS — R7301 Impaired fasting glucose: Secondary | ICD-10-CM | POA: Diagnosis not present

## 2022-01-14 DIAGNOSIS — R635 Abnormal weight gain: Secondary | ICD-10-CM | POA: Diagnosis not present

## 2022-01-14 DIAGNOSIS — E039 Hypothyroidism, unspecified: Secondary | ICD-10-CM | POA: Diagnosis not present

## 2022-01-28 ENCOUNTER — Ambulatory Visit: Payer: BC Managed Care – PPO | Admitting: Interventional Cardiology

## 2022-01-28 DIAGNOSIS — L578 Other skin changes due to chronic exposure to nonionizing radiation: Secondary | ICD-10-CM | POA: Diagnosis not present

## 2022-01-28 DIAGNOSIS — D1801 Hemangioma of skin and subcutaneous tissue: Secondary | ICD-10-CM | POA: Diagnosis not present

## 2022-01-28 DIAGNOSIS — L821 Other seborrheic keratosis: Secondary | ICD-10-CM | POA: Diagnosis not present

## 2022-01-28 DIAGNOSIS — L814 Other melanin hyperpigmentation: Secondary | ICD-10-CM | POA: Diagnosis not present

## 2022-02-04 ENCOUNTER — Ambulatory Visit: Payer: BC Managed Care – PPO | Admitting: Cardiovascular Disease

## 2022-02-04 ENCOUNTER — Encounter: Payer: Self-pay | Admitting: Cardiovascular Disease

## 2022-02-04 ENCOUNTER — Other Ambulatory Visit: Payer: Self-pay

## 2022-02-04 VITALS — BP 126/78 | HR 57 | Ht 64.0 in | Wt 186.6 lb

## 2022-02-04 DIAGNOSIS — R0609 Other forms of dyspnea: Secondary | ICD-10-CM | POA: Diagnosis not present

## 2022-02-04 DIAGNOSIS — R079 Chest pain, unspecified: Secondary | ICD-10-CM | POA: Diagnosis not present

## 2022-02-04 NOTE — Progress Notes (Signed)
? ?Chief Complaint  ?Patient presents with  ? New Patient (Initial Visit)  ?  Chest pain  ? ?History of Present Illness:50 yo female with history of hyperlipidemia, hypothyroidism and tobacco abuse here today as a new patient for the evaluation of chest pain. She was seen in the ED at [facility] in July 2022 with chest pain. This was in her left shoulder and chest. The pain resolved after a day or so. Rare chest pains now. She has some dyspnea with moderate exertion. No LE edema, dizziness, near syncope. She is an active smoker. No exertional chest pain. The left shoulder pain and chest pain can occur at rest.  ? ?Primary Care Physician: [facility] ? ? ?Past Medical History:  ?Diagnosis Date  ? Chest pain   ? Hyperlipidemia   ? Hypothyroidism   ? Tobacco abuse   ? ? ?Past Surgical History:  ?Procedure Laterality Date  ? BREAST BIOPSY Left 2018  ? PSEUDOANGIOMATOUS STROMAL HYPERPLASIA,  ? BREAST BIOPSY Left 2017  ? CARPAL TUNNEL RELEASE Right   ? PARTIAL HYSTERECTOMY    ? ? ?Current Outpatient Medications  ?Medication Sig Dispense Refill  ? cholecalciferol (VITAMIN D3) 25 MCG (1000 UNIT) tablet Take 1,000 Units by mouth daily.    ? levothyroxine (SYNTHROID) 125 MCG tablet TAKE 1 TABLET BY MOUTH EVERY DAY IN THE MORNING ON EMPTY STOMACH FOR 30 DAYS    ? omeprazole (PRILOSEC) 10 MG capsule Take 10 mg by mouth daily.    ? rosuvastatin (CRESTOR) 5 MG tablet Take 5 mg by mouth daily.    ? TRULICITY 1.5 MG/0.5ML SOPN Inject 1.5 mg into the skin once a week.    ? ?No current facility-administered medications for this visit.  ? ? ?Allergies  ?Allergen Reactions  ? Amoxicillin   ?  hives  ? Keflex [Cephalexin]   ?  hives  ? Sulfa Antibiotics Other (See Comments)  ? ? ?Social History  ? ?Socioeconomic History  ? Marital status: Single  ?  Spouse name: Not on file  ? Number of children: Not on file  ? Years of education: Not on file  ? Highest education level: Not on file  ?Occupational History  ? Occupation: [employer]   ?Tobacco Use  ? Smoking status: Every Day  ?  Packs/day: 1.00  ?  Years: 35.00  ?  Pack years: 35.00  ?  Types: Cigarettes  ?  Start date: 12/06/1985  ?  Passive exposure: Current  ? Smokeless tobacco: Never  ?Substance and Sexual Activity  ? Alcohol use: Not Currently  ? Drug use: Not on file  ? Sexual activity: Not on file  ?Other Topics Concern  ? Not on file  ?Social History Narrative  ? Not on file  ? ?Social Determinants of Health  ? ?Financial Resource Strain: Not on file  ?Food Insecurity: Not on file  ?Transportation Needs: Not on file  ?Physical Activity: Not on file  ?Stress: Not on file  ?Social Connections: Not on file  ?Intimate Partner Violence: Not on file  ? ? ?Family History  ?Problem Relation Age of Onset  ? Heart failure Father   ? Heart attack Paternal Grandmother   ? Breast cancer Maternal Aunt   ?     early 62's  ? ? ?Review of Systems:  As stated in the HPI and otherwise negative.  ? ?BP 126/78   Pulse (!) 57   Ht 5\' 4"  (1.626 m)   Wt 186 lb 9.6 oz (  84.6 kg)   SpO2 98%   BMI 32.03 kg/m?  ? ?Physical Examination: ?General: Well developed, well nourished, NAD  ?HEENT: OP clear, mucus membranes moist  ?SKIN: warm, dry. No rashes. ?Neuro: No focal deficits  ?Musculoskeletal: Muscle strength 5/5 all ext  ?Psychiatric: Mood and affect normal  ?Neck: No JVD, no carotid bruits, no thyromegaly, no lymphadenopathy.  ?Lungs:Clear bilaterally, no wheezes, rhonci, crackles ?Cardiovascular: Regular rate and rhythm. No murmurs, gallops or rubs. ?Abdomen:Soft. Bowel sounds present. Non-tender.  ?Extremities: No lower extremity edema. Pulses are 2 + in the bilateral DP/PT. ? ?EKG:  EKG is ordered today. ?The ekg ordered today demonstrates Sinus ? ?Recent Labs: ?06/30/2021: BUN 11; Creatinine, Ser 0.78; Hemoglobin 12.6; Platelets 155; Potassium 3.9; Sodium 137; TSH 1.480  ? ?Lipid Panel ?No results found for: CHOL, TRIG, HDL, CHOLHDL, VLDL, LDLCALC, LDLDIRECT ?  ?Wt Readings from Last 3 Encounters:   ?02/04/22 186 lb 9.6 oz (84.6 kg)  ?06/29/21 185 lb (83.9 kg)  ?  ? ?Assessment and Plan:  ? ?1. Chest pain/Dyspnea on exertion: Will arrange an echo to assess LVEF and exclude structural heart disease. Exercise stress test to exclude ischemia and assess exercise tolerance.  ? ?Labs/ tests ordered today include:  ? ?Orders Placed This Encounter  ?Procedures  ? Cardiac Stress Test: Informed Consent Details: Physician/Practitioner Attestation; Transcribe to consent form and obtain patient signature  ? Exercise Tolerance Test  ? EKG 12-Lead  ? ECHOCARDIOGRAM COMPLETE  ? ?Disposition:   F/U with me in 4-8 weeks.  ? ? ?Signed, ?Verne Carrow, MD ?02/04/2022 10:51 AM    ?[facility] ?[address] [place], [place], [place]  [address] ?Phone: [phone]; Fax: [phone]  ? ? ?

## 2022-02-04 NOTE — Patient Instructions (Signed)
Medication Instructions:  ?No changes ?*If you need a refill on your cardiac medications before your next appointment, please call your pharmacy* ? ? ?Lab Work: ?none ?If you have labs (blood work) drawn today and your tests are completely normal, you will receive your results only by: ?MyChart Message (if you have MyChart) OR ?A paper copy in the mail ?If you have any lab test that is abnormal or we need to change your treatment, we will call you to review the results. ? ? ?Testing/Procedures: ?Your physician has requested that you have an echocardiogram. Echocardiography is a painless test that uses sound waves to create images of your heart. It provides your doctor with information about the size and shape of your heart and how well your heart?s chambers and valves are working. This procedure takes approximately one hour. There are no restrictions for this procedure. ? ?Your physician has requested that you have an exercise tolerance test. For further information please visit [email or web address] Please also follow instruction sheet, as given. ? ? ? ?Follow-Up: ?At [facility], you and your health needs are our priority.  As part of our continuing mission to provide you with exceptional heart care, we have created designated Provider Care Teams.  These Care Teams include your primary Cardiologist (physician) and Advanced Practice Providers (APPs -  Physician Assistants and Nurse Practitioners) who all work together to provide you with the care you need, when you need it.   ? ?Your next appointment:   ?1-2 month(s) ? ?The format for your next appointment:   ?In Person ? ?Provider:   ?Verne Carrow, MD   ? ? ?Other Instructions ?  ?

## 2022-02-08 DIAGNOSIS — H109 Unspecified conjunctivitis: Secondary | ICD-10-CM | POA: Diagnosis not present

## 2022-02-25 ENCOUNTER — Ambulatory Visit (INDEPENDENT_AMBULATORY_CARE_PROVIDER_SITE_OTHER): Payer: BC Managed Care – PPO

## 2022-02-25 ENCOUNTER — Ambulatory Visit (HOSPITAL_COMMUNITY): Payer: BC Managed Care – PPO | Attending: Cardiovascular Disease

## 2022-02-25 ENCOUNTER — Other Ambulatory Visit: Payer: Self-pay

## 2022-02-25 DIAGNOSIS — R0609 Other forms of dyspnea: Secondary | ICD-10-CM | POA: Diagnosis not present

## 2022-02-25 DIAGNOSIS — I361 Nonrheumatic tricuspid (valve) insufficiency: Secondary | ICD-10-CM | POA: Diagnosis not present

## 2022-02-25 DIAGNOSIS — R079 Chest pain, unspecified: Secondary | ICD-10-CM

## 2022-02-25 LAB — EXERCISE TOLERANCE TEST
Angina Index: 0
Duke Treadmill Score: 10
Estimated workload: 11
Exercise duration (min): 9 min
Exercise duration (sec): 47 s
MPHR: 172 {beats}/min
Peak HR: 144 {beats}/min
Percent HR: 83 %
RPE: 16
Rest HR: 59 {beats}/min
ST Depression (mm): 0 mm

## 2022-02-25 LAB — ECHOCARDIOGRAM COMPLETE
Area-P 1/2: 3.42 cm2
S' Lateral: 2.7 cm

## 2022-03-08 ENCOUNTER — Ambulatory Visit: Payer: BC Managed Care – PPO | Admitting: Cardiovascular Disease

## 2022-07-26 DIAGNOSIS — E7800 Pure hypercholesterolemia, unspecified: Secondary | ICD-10-CM | POA: Diagnosis not present

## 2022-07-26 DIAGNOSIS — E039 Hypothyroidism, unspecified: Secondary | ICD-10-CM | POA: Diagnosis not present

## 2022-07-26 DIAGNOSIS — R7301 Impaired fasting glucose: Secondary | ICD-10-CM | POA: Diagnosis not present

## 2022-08-05 DIAGNOSIS — R635 Abnormal weight gain: Secondary | ICD-10-CM | POA: Diagnosis not present

## 2022-08-05 DIAGNOSIS — R7301 Impaired fasting glucose: Secondary | ICD-10-CM | POA: Diagnosis not present

## 2022-08-05 DIAGNOSIS — E039 Hypothyroidism, unspecified: Secondary | ICD-10-CM | POA: Diagnosis not present

## 2022-08-05 DIAGNOSIS — R5383 Other fatigue: Secondary | ICD-10-CM | POA: Diagnosis not present

## 2022-08-27 ENCOUNTER — Ambulatory Visit
Admission: EM | Admit: 2022-08-27 | Discharge: 2022-08-27 | Disposition: A | Discharge status: Home / Self Care | Payer: BC Managed Care – PPO | Attending: Emergency Medicine | Admitting: Emergency Medicine

## 2022-08-27 ENCOUNTER — Encounter: Payer: Self-pay | Admitting: Emergency Medicine

## 2022-08-27 ENCOUNTER — Ambulatory Visit (INDEPENDENT_AMBULATORY_CARE_PROVIDER_SITE_OTHER): Payer: BC Managed Care – PPO

## 2022-08-27 DIAGNOSIS — R059 Cough, unspecified: Secondary | ICD-10-CM

## 2022-08-27 DIAGNOSIS — Z79899 Other long term (current) drug therapy: Secondary | ICD-10-CM | POA: Diagnosis not present

## 2022-08-27 DIAGNOSIS — Z20822 Contact with and (suspected) exposure to covid-19: Secondary | ICD-10-CM | POA: Insufficient documentation

## 2022-08-27 DIAGNOSIS — J069 Acute upper respiratory infection, unspecified: Secondary | ICD-10-CM | POA: Diagnosis not present

## 2022-08-27 DIAGNOSIS — R058 Other specified cough: Secondary | ICD-10-CM | POA: Diagnosis not present

## 2022-08-27 DIAGNOSIS — Z792 Long term (current) use of antibiotics: Secondary | ICD-10-CM | POA: Insufficient documentation

## 2022-08-27 DIAGNOSIS — J0100 Acute maxillary sinusitis, unspecified: Secondary | ICD-10-CM

## 2022-08-27 LAB — GROUP A STREP BY PCR: Group A Strep by PCR: NOT DETECTED

## 2022-08-27 LAB — RESP PANEL BY RT-PCR (FLU A&B, COVID) ARPGX2
Influenza A by PCR: NEGATIVE
Influenza B by PCR: NEGATIVE
SARS Coronavirus 2 by RT PCR: NEGATIVE

## 2022-08-27 MED ORDER — DOXYCYCLINE HYCLATE 100 MG PO CAPS: 100.0000 mg | ORAL_CAPSULE | Freq: Two times a day (BID) | ORAL | 0 refills | Status: AC

## 2022-08-27 MED ORDER — PROMETHAZINE-DM 6.25-15 MG/5ML PO SYRP: 5.0000 mL | ORAL_SOLUTION | Freq: Four times a day (QID) | ORAL | 0 refills | Status: AC | PRN

## 2022-08-27 MED ORDER — IBUPROFEN 600 MG PO TABS: 600.0000 mg | ORAL_TABLET | Freq: Four times a day (QID) | ORAL | 0 refills | Status: AC | PRN

## 2022-08-27 MED ORDER — FLUTICASONE PROPIONATE 50 MCG/ACT NA SUSP: 2.0000 | Freq: Every day | NASAL | 0 refills | Status: AC

## 2022-08-27 NOTE — ED Provider Notes (Addendum)
HPI  SUBJECTIVE:  Margaret Goodwin is a 51 y.o. female who presents with 4 days of sinus pain and pressure, nasal congestion, headache, sore throat, chest congestion, cough productive of thick, brown and yellow phlegm, substernal chest soreness secondary to the cough, postnasal drip.  Symptoms started the day after starting a job at [employer], where she works in a dusty environment.  She denies allergy symptoms.  No fevers, body aches, facial swelling, upper dental pain, loss of sense of smell or taste, wheezing, shortness of breath, nausea, vomiting, diarrhea, abdominal pain, rash.  No known strep, COVID, flu exposure. She got the [employer] COVID-vaccine She was exposed to a grandchild with pneumonia recently.   She is unable to sleep at night secondary to cough.  She tried 500 mg of Tylenol, 600 mg of ibuprofen, Alka-Seltzer cold and flu.  The ibuprofen helps.  Symptoms worse with being up.  No antibiotics in the past month.  No antipyretic in the past 6 hours.  She is a smoker and gets sinusitis once a year.  No history of pulmonary disease, diabetes, hypertension.  LMP: Status post hysterectomy.  PCP: [facility].    Past Medical History:  Diagnosis Date   Chest pain    Hyperlipidemia    Hypothyroidism    Tobacco abuse     Past Surgical History:  Procedure Laterality Date   BREAST BIOPSY Left 2018   PSEUDOANGIOMATOUS STROMAL HYPERPLASIA,   BREAST BIOPSY Left 2017   CARPAL TUNNEL RELEASE Right    PARTIAL HYSTERECTOMY      Family History  Problem Relation Age of Onset   Heart failure Father    Heart attack Paternal Grandmother    Breast cancer Maternal Aunt        early 36's    Social History   Tobacco Use   Smoking status: Every Day    Packs/day: 1.00    Years: 35.00    Total pack years: 35.00    Types: Cigarettes    Start date: 12/06/1985    Passive exposure: Current   Smokeless tobacco: Never  Vaping Use   Vaping Use: Never used  Substance Use Topics   Alcohol  use: Not Currently    No current facility-administered medications for this encounter.  Current Outpatient Medications:    doxycycline (VIBRAMYCIN) 100 MG capsule, Take 1 capsule (100 mg total) by mouth 2 (two) times daily for 10 days., Disp: 20 capsule, Rfl: 0   fluticasone (FLONASE) 50 MCG/ACT nasal spray, Place 2 sprays into both nostrils daily., Disp: 16 g, Rfl: 0   ibuprofen (ADVIL) 600 MG tablet, Take 1 tablet (600 mg total) by mouth every 6 (six) hours as needed., Disp: 30 tablet, Rfl: 0   levothyroxine (SYNTHROID) 125 MCG tablet, TAKE 1 TABLET BY MOUTH EVERY DAY IN THE MORNING ON EMPTY STOMACH FOR 30 DAYS, Disp: , Rfl:    omeprazole (PRILOSEC) 10 MG capsule, Take 10 mg by mouth daily., Disp: , Rfl:    promethazine-dextromethorphan (PROMETHAZINE-DM) 6.25-15 MG/5ML syrup, Take 5 mLs by mouth 4 (four) times daily as needed for cough., Disp: 118 mL, Rfl: 0   rosuvastatin (CRESTOR) 5 MG tablet, Take 5 mg by mouth daily., Disp: , Rfl:    TRULICITY 1.5 MG/0.5ML SOPN, Inject 1.5 mg into the skin once a week., Disp: , Rfl:    cholecalciferol (VITAMIN D3) 25 MCG (1000 UNIT) tablet, Take 1,000 Units by mouth daily., Disp: , Rfl:   Allergies  Allergen Reactions   Amoxicillin  hives   Keflex [Cephalexin]     hives   Sulfa Antibiotics Other (See Comments)     ROS  As noted in HPI.   Physical Exam  BP 118/79 (BP Location: Left Arm)   Pulse 68   Temp 98.2 F (36.8 C) (Oral)   Resp 14   Ht 5\' 4"  (1.626 m)   Wt 81.6 kg   SpO2 98%   BMI 30.90 kg/m   Constitutional: Well developed, well nourished, no acute distress Eyes:  EOMI, conjunctiva normal bilaterally HENT: Normocephalic, atraumatic,mucus membranes moist.  Erythematous, swollen turbinates.  Positive maxillary sinus tenderness.  Positive nasal congestion.  Slightly erythematous oropharynx, tonsils normal size without exudates.  Uvula midline.  No postnasal drip. Neck: Positive cervical lymphadenopathy Respiratory: Normal  inspiratory effort, lungs clear bilaterally.  Positive lateral chest wall tenderness. Cardiovascular: Normal rate, regular rhythm, no murmurs rubs or gallops. GI: nondistended skin: No rash, skin intact Musculoskeletal: no deformities Neurologic: Alert & oriented x 3, no focal neuro deficits Psychiatric: Speech and behavior appropriate   ED Course   Medications - No data to display  Orders Placed This Encounter  Procedures   Group A Strep by PCR    Standing Status:   Standing    Number of Occurrences:   1   Resp Panel by RT-PCR (Flu A&B, Covid)    Standing Status:   Standing    Number of Occurrences:   1   DG Chest 2 View    Standing Status:   Standing    Number of Occurrences:   1    Order Specific Question:   Reason for Exam (SYMPTOM  OR DIAGNOSIS REQUIRED)    Answer:   Productive cough.  Smoker.  Rule out pneumonia.    Results for orders placed or performed during the hospital encounter of 08/27/22 (from the past 24 hour(s))  Group A Strep by PCR     Status: None   Collection Time: 08/27/22  5:53 PM   Specimen: Throat; Sterile Swab  Result Value Ref Range   Group A Strep by PCR NOT DETECTED NOT DETECTED  Resp Panel by RT-PCR (Flu A&B, Covid)     Status: None   Collection Time: 08/27/22  5:53 PM  Result Value Ref Range   SARS Coronavirus 2 by RT PCR NEGATIVE NEGATIVE   Influenza A by PCR NEGATIVE NEGATIVE   Influenza B by PCR NEGATIVE NEGATIVE   DG Chest 2 View  Result Date: 08/27/2022 CLINICAL DATA:  Productive cough, smoker; concern for pneumonia EXAM: CHEST - 2 VIEW COMPARISON:  06/30/2021 FINDINGS: Cardiac and mediastinal contours are within normal limits. No focal pulmonary opacity. No pleural effusion or pneumothorax. No acute osseous abnormality. IMPRESSION: No acute cardiopulmonary process. Electronically Signed   By: 07/02/2021 M.D.   On: 08/27/2022 19:05    ED Clinical Impression  1. Acute non-recurrent maxillary sinusitis   2. Viral URI with cough       ED Assessment/Plan     Checking COVID, flu, strep and chest x-ray due to productive cough and exposure to pneumonia, chest wall tenderness.  Doubt ACS.  She states that she has chest soreness only after coughing.  She denies chest tightness, pressure or heaviness.  Reviewed imaging independently.  No acute cardiopulmonary disease.  See radiology report for full details.  Strep, COVID, influenza PCR negative.  Presentation consistent with a sinusitis/URI with cough.  Home with Tylenol/ibuprofen, Benadryl/Maalox, Mucinex D, start saline nasal irrigation, Flonase, Promethazine DM for cough.  Stop other cold medications. She does have sinus tenderness, Will send home with a wait-and-see prescription of doxycycline.  Return to work on [employer] given  Discussed labs, imaging, MDM, treatment plan, and plan for follow-up with patient. Discussed sn/sx that should prompt return to the ED. patient agrees with plan.   Meds ordered this encounter  Medications   fluticasone (FLONASE) 50 MCG/ACT nasal spray    Sig: Place 2 sprays into both nostrils daily.    Dispense:  16 g    Refill:  0   promethazine-dextromethorphan (PROMETHAZINE-DM) 6.25-15 MG/5ML syrup    Sig: Take 5 mLs by mouth 4 (four) times daily as needed for cough.    Dispense:  118 mL    Refill:  0   doxycycline (VIBRAMYCIN) 100 MG capsule    Sig: Take 1 capsule (100 mg total) by mouth 2 (two) times daily for 10 days.    Dispense:  20 capsule    Refill:  0   ibuprofen (ADVIL) 600 MG tablet    Sig: Take 1 tablet (600 mg total) by mouth every 6 (six) hours as needed.    Dispense:  30 tablet    Refill:  0      *This clinic note was created using [employer]. Therefore, there may be occasional mistakes despite careful proofreading.  ?    Melynda Ripple, MD 08/27/22 1919    Melynda Ripple, MD 08/27/22 Lurena Nida

## 2022-08-27 NOTE — Discharge Instructions (Addendum)
COVID, influenza and strep PCR were all negative.  Your chest x-ray did not show any evidence of pneumonia today.  Start Mucinex D, saline nasal irrigation, Flonase, Promethazine DM for cough.  Stop other cold and flu medications.  May take 600 mg of ibuprofen with 1000 mg of Tylenol together 3-4 times a day as needed for pain.  5 mL of children's liquid Benadryl mixed with 5 mL of Maalox.  Gargle and swallow.  This will help with your sore throat.  Or you can try buttermilk.  I am sending you home with a prescription of doxycycline for possible sinusitis.  I would wait a few more days before filling this.  However, if you are not getting any better, go ahead and start it.

## 2022-08-27 NOTE — ED Triage Notes (Signed)
Patient c/o sinus congestion and pressure, sore throat, and cough that started on Tuesday.  Patient denies fevers.

## 2022-09-02 DIAGNOSIS — M47816 Spondylosis without myelopathy or radiculopathy, lumbar region: Secondary | ICD-10-CM | POA: Diagnosis not present

## 2022-09-02 DIAGNOSIS — Z6831 Body mass index (BMI) 31.0-31.9, adult: Secondary | ICD-10-CM | POA: Diagnosis not present

## 2022-10-31 DIAGNOSIS — J189 Pneumonia, unspecified organism: Secondary | ICD-10-CM | POA: Diagnosis not present

## 2022-10-31 DIAGNOSIS — Z20822 Contact with and (suspected) exposure to covid-19: Secondary | ICD-10-CM | POA: Diagnosis not present

## 2022-11-12 IMAGING — CR DG CHEST 2V
2 series · 2 of 2 positions shown · non-contrast
Comparison: No prior.

CLINICAL DATA: Left-sided chest pain.

EXAM:
CHEST - 2 VIEW

[chest pa]
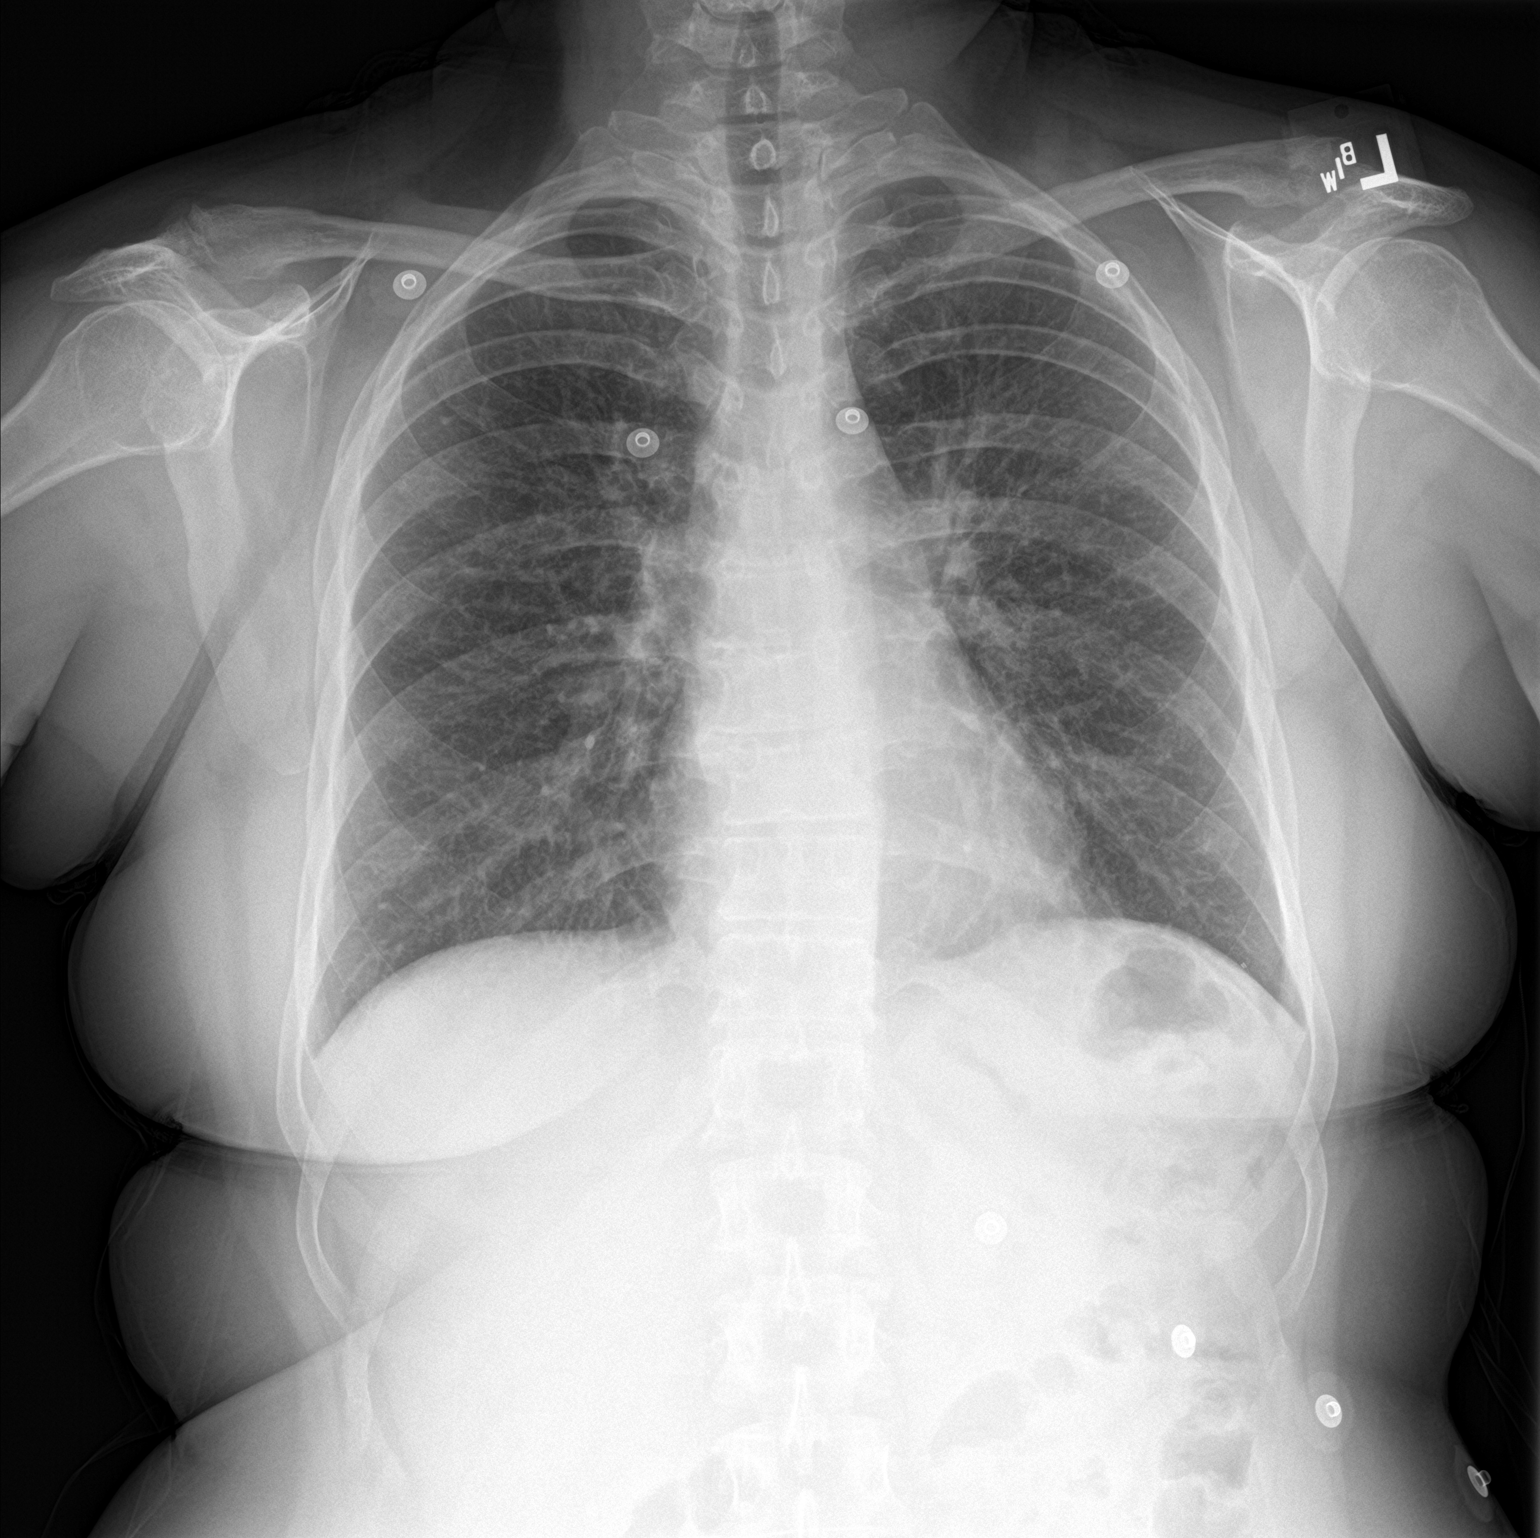

[chest lat]
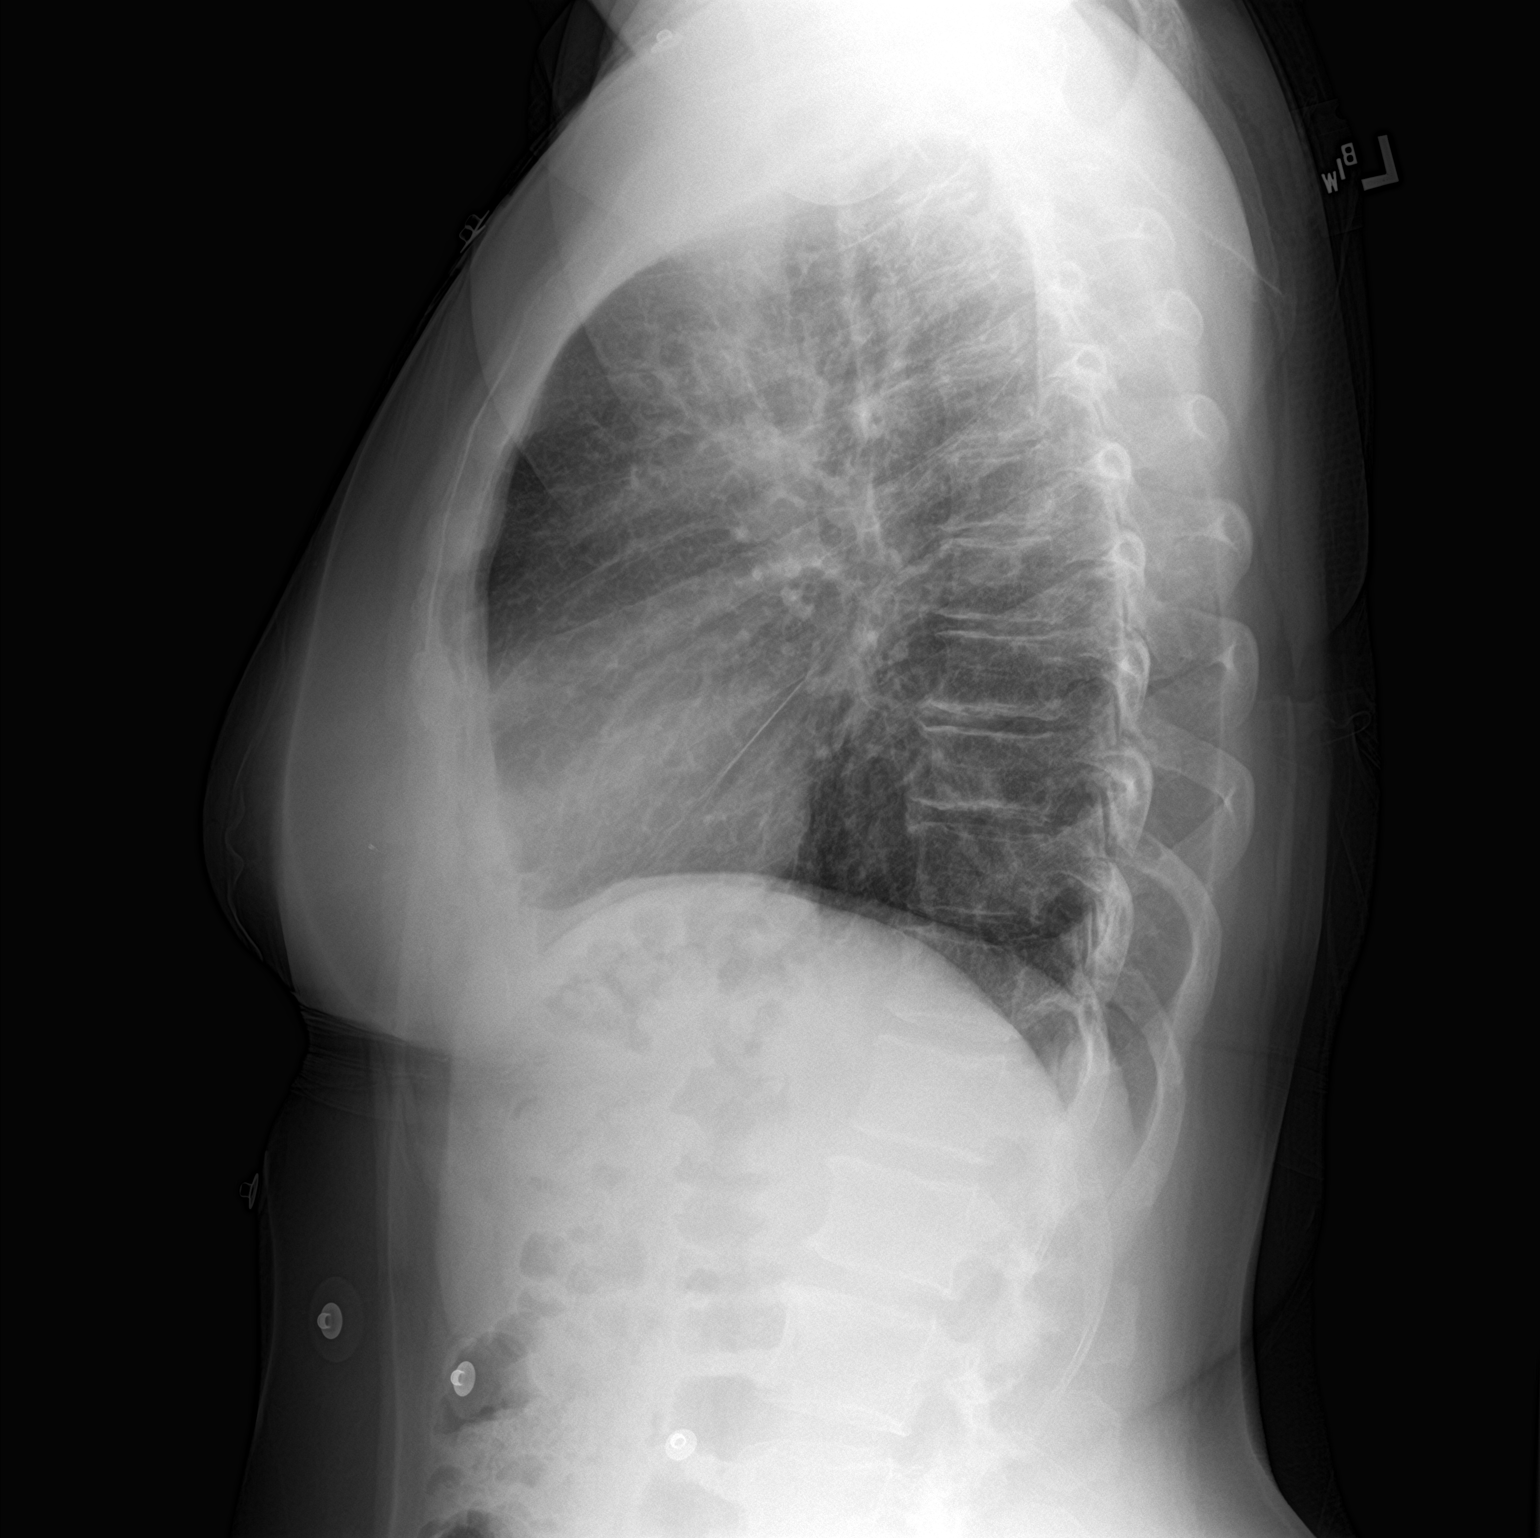

[2 of 2 positions shown; findings below may reference images not displayed]

FINDINGS: Mediastinum hilar structures normal. Heart size normal. Mild
bilateral interstitial prominence. These changes may be chronic. An
acute process including pneumonitis cannot be completely excluded.
No pleural effusion or pneumothorax. Degenerative change thoracic
spine.
IMPRESSION: Mild bilateral interstitial prominence. These changes may be
chronic. An acute process including pneumonitis cannot be completely
excluded

## 2022-11-22 DIAGNOSIS — Z1231 Encounter for screening mammogram for malignant neoplasm of breast: Secondary | ICD-10-CM | POA: Diagnosis not present

## 2022-11-22 DIAGNOSIS — Z01419 Encounter for gynecological examination (general) (routine) without abnormal findings: Secondary | ICD-10-CM | POA: Diagnosis not present

## 2022-11-22 DIAGNOSIS — Z6830 Body mass index (BMI) 30.0-30.9, adult: Secondary | ICD-10-CM | POA: Diagnosis not present

## 2022-11-23 ENCOUNTER — Other Ambulatory Visit: Payer: Self-pay | Admitting: Obstetrics and Gynecology

## 2022-11-23 DIAGNOSIS — Z8249 Family history of ischemic heart disease and other diseases of the circulatory system: Secondary | ICD-10-CM

## 2023-04-29 DIAGNOSIS — M2550 Pain in unspecified joint: Secondary | ICD-10-CM | POA: Diagnosis not present

## 2023-04-29 DIAGNOSIS — R7301 Impaired fasting glucose: Secondary | ICD-10-CM | POA: Diagnosis not present

## 2023-04-29 DIAGNOSIS — E039 Hypothyroidism, unspecified: Secondary | ICD-10-CM | POA: Diagnosis not present

## 2023-04-29 DIAGNOSIS — E7800 Pure hypercholesterolemia, unspecified: Secondary | ICD-10-CM | POA: Diagnosis not present

## 2023-05-10 ENCOUNTER — Other Ambulatory Visit: Payer: Self-pay | Admitting: Endocrinology

## 2023-05-10 DIAGNOSIS — E039 Hypothyroidism, unspecified: Secondary | ICD-10-CM | POA: Diagnosis not present

## 2023-05-10 DIAGNOSIS — E7800 Pure hypercholesterolemia, unspecified: Secondary | ICD-10-CM

## 2023-05-10 DIAGNOSIS — R5383 Other fatigue: Secondary | ICD-10-CM | POA: Diagnosis not present

## 2023-05-10 DIAGNOSIS — R7301 Impaired fasting glucose: Secondary | ICD-10-CM | POA: Diagnosis not present

## 2023-05-10 DIAGNOSIS — R635 Abnormal weight gain: Secondary | ICD-10-CM | POA: Diagnosis not present

## 2023-06-08 ENCOUNTER — Ambulatory Visit
Admission: RE | Admit: 2023-06-08 | Discharge: 2023-06-08 | Disposition: A | Discharge status: Home / Self Care | Payer: BC Managed Care – PPO | Source: Ambulatory Visit | Attending: Endocrinology | Admitting: Endocrinology

## 2023-06-08 DIAGNOSIS — M2550 Pain in unspecified joint: Secondary | ICD-10-CM | POA: Diagnosis not present

## 2023-06-08 DIAGNOSIS — E7800 Pure hypercholesterolemia, unspecified: Secondary | ICD-10-CM | POA: Insufficient documentation

## 2023-06-08 DIAGNOSIS — E039 Hypothyroidism, unspecified: Secondary | ICD-10-CM | POA: Diagnosis not present

## 2023-06-08 DIAGNOSIS — R7301 Impaired fasting glucose: Secondary | ICD-10-CM | POA: Diagnosis not present

## 2023-09-01 DIAGNOSIS — E039 Hypothyroidism, unspecified: Secondary | ICD-10-CM | POA: Diagnosis not present

## 2023-09-01 DIAGNOSIS — R7301 Impaired fasting glucose: Secondary | ICD-10-CM | POA: Diagnosis not present

## 2023-09-01 DIAGNOSIS — M2550 Pain in unspecified joint: Secondary | ICD-10-CM | POA: Diagnosis not present

## 2023-09-01 DIAGNOSIS — E7800 Pure hypercholesterolemia, unspecified: Secondary | ICD-10-CM | POA: Diagnosis not present

## 2023-11-09 DIAGNOSIS — E559 Vitamin D deficiency, unspecified: Secondary | ICD-10-CM | POA: Diagnosis not present

## 2023-11-09 DIAGNOSIS — R7301 Impaired fasting glucose: Secondary | ICD-10-CM | POA: Diagnosis not present

## 2023-11-09 DIAGNOSIS — E7800 Pure hypercholesterolemia, unspecified: Secondary | ICD-10-CM | POA: Diagnosis not present

## 2023-11-09 DIAGNOSIS — E039 Hypothyroidism, unspecified: Secondary | ICD-10-CM | POA: Diagnosis not present

## 2023-11-15 DIAGNOSIS — R5383 Other fatigue: Secondary | ICD-10-CM | POA: Diagnosis not present

## 2023-11-15 DIAGNOSIS — E039 Hypothyroidism, unspecified: Secondary | ICD-10-CM | POA: Diagnosis not present

## 2023-11-15 DIAGNOSIS — R7301 Impaired fasting glucose: Secondary | ICD-10-CM | POA: Diagnosis not present

## 2023-11-15 DIAGNOSIS — R635 Abnormal weight gain: Secondary | ICD-10-CM | POA: Diagnosis not present

## 2023-12-05 DIAGNOSIS — Z1231 Encounter for screening mammogram for malignant neoplasm of breast: Secondary | ICD-10-CM | POA: Diagnosis not present

## 2023-12-05 DIAGNOSIS — Z6834 Body mass index (BMI) 34.0-34.9, adult: Secondary | ICD-10-CM | POA: Diagnosis not present

## 2023-12-05 DIAGNOSIS — Z01419 Encounter for gynecological examination (general) (routine) without abnormal findings: Secondary | ICD-10-CM | POA: Diagnosis not present

## 2023-12-06 ENCOUNTER — Other Ambulatory Visit: Payer: Self-pay | Admitting: Obstetrics and Gynecology

## 2023-12-06 DIAGNOSIS — Z720 Tobacco use: Secondary | ICD-10-CM

## 2023-12-14 ENCOUNTER — Encounter: Payer: Self-pay | Admitting: Obstetrics and Gynecology

## 2023-12-21 ENCOUNTER — Ambulatory Visit
Admission: RE | Admit: 2023-12-21 | Discharge: 2023-12-21 | Disposition: A | Discharge status: Home / Self Care | Payer: BC Managed Care – PPO | Source: Ambulatory Visit | Attending: Obstetrics and Gynecology | Admitting: Obstetrics and Gynecology

## 2023-12-21 DIAGNOSIS — Z720 Tobacco use: Secondary | ICD-10-CM

## 2023-12-26 DIAGNOSIS — I87391 Chronic venous hypertension (idiopathic) with other complications of right lower extremity: Secondary | ICD-10-CM | POA: Diagnosis not present

## 2023-12-26 DIAGNOSIS — M79661 Pain in right lower leg: Secondary | ICD-10-CM | POA: Diagnosis not present

## 2023-12-26 DIAGNOSIS — I83891 Varicose veins of right lower extremities with other complications: Secondary | ICD-10-CM | POA: Diagnosis not present

## 2023-12-26 DIAGNOSIS — M79604 Pain in right leg: Secondary | ICD-10-CM | POA: Diagnosis not present

## 2023-12-26 DIAGNOSIS — R600 Localized edema: Secondary | ICD-10-CM | POA: Diagnosis not present

## 2023-12-27 ENCOUNTER — Ambulatory Visit
Admission: RE | Admit: 2023-12-27 | Discharge: 2023-12-27 | Disposition: A | Discharge status: Home / Self Care | Payer: BC Managed Care – PPO | Source: Ambulatory Visit | Attending: Obstetrics and Gynecology | Admitting: Obstetrics and Gynecology

## 2023-12-27 DIAGNOSIS — I83891 Varicose veins of right lower extremities with other complications: Secondary | ICD-10-CM | POA: Diagnosis not present

## 2023-12-27 DIAGNOSIS — F17210 Nicotine dependence, cigarettes, uncomplicated: Secondary | ICD-10-CM | POA: Diagnosis not present

## 2024-02-07 DIAGNOSIS — I872 Venous insufficiency (chronic) (peripheral): Secondary | ICD-10-CM | POA: Diagnosis not present

## 2024-02-29 DIAGNOSIS — E7800 Pure hypercholesterolemia, unspecified: Secondary | ICD-10-CM | POA: Diagnosis not present

## 2024-02-29 DIAGNOSIS — E039 Hypothyroidism, unspecified: Secondary | ICD-10-CM | POA: Diagnosis not present

## 2024-02-29 DIAGNOSIS — E559 Vitamin D deficiency, unspecified: Secondary | ICD-10-CM | POA: Diagnosis not present

## 2024-02-29 DIAGNOSIS — R7301 Impaired fasting glucose: Secondary | ICD-10-CM | POA: Diagnosis not present

## 2024-03-06 DIAGNOSIS — Z13820 Encounter for screening for osteoporosis: Secondary | ICD-10-CM | POA: Diagnosis not present

## 2024-03-15 DIAGNOSIS — R635 Abnormal weight gain: Secondary | ICD-10-CM | POA: Diagnosis not present

## 2024-03-15 DIAGNOSIS — R7301 Impaired fasting glucose: Secondary | ICD-10-CM | POA: Diagnosis not present

## 2024-03-15 DIAGNOSIS — E039 Hypothyroidism, unspecified: Secondary | ICD-10-CM | POA: Diagnosis not present

## 2024-03-15 DIAGNOSIS — R5383 Other fatigue: Secondary | ICD-10-CM | POA: Diagnosis not present

## 2024-05-14 DIAGNOSIS — R7301 Impaired fasting glucose: Secondary | ICD-10-CM | POA: Diagnosis not present

## 2024-05-14 DIAGNOSIS — E7800 Pure hypercholesterolemia, unspecified: Secondary | ICD-10-CM | POA: Diagnosis not present

## 2024-05-14 DIAGNOSIS — E039 Hypothyroidism, unspecified: Secondary | ICD-10-CM | POA: Diagnosis not present

## 2024-05-14 DIAGNOSIS — E559 Vitamin D deficiency, unspecified: Secondary | ICD-10-CM | POA: Diagnosis not present

## 2024-07-04 ENCOUNTER — Ambulatory Visit: Admitting: Cardiovascular Disease
